# Patient Record
Sex: Female | Born: 1975
Health system: Southern US, Community
[De-identification: ages and names within clinical notes are randomized; demographics above are authoritative.]

## PROBLEM LIST (undated history)

## (undated) DIAGNOSIS — Z8489 Family history of other specified conditions: Secondary | ICD-10-CM

## (undated) DIAGNOSIS — I499 Cardiac arrhythmia, unspecified: Secondary | ICD-10-CM

---

## 2003-04-23 ENCOUNTER — Inpatient Hospital Stay (HOSPITAL_COMMUNITY): Admission: AD | Admit: 2003-04-23 | Discharge: 2003-04-26 | Payer: Self-pay | Admitting: Obstetrics and Gynecology

## 2003-04-25 ENCOUNTER — Encounter (INDEPENDENT_AMBULATORY_CARE_PROVIDER_SITE_OTHER): Payer: Self-pay

## 2003-10-02 ENCOUNTER — Other Ambulatory Visit: Admission: RE | Admit: 2003-10-02 | Discharge: 2003-10-02 | Payer: Self-pay | Admitting: Obstetrics and Gynecology

## 2004-06-25 ENCOUNTER — Ambulatory Visit (HOSPITAL_COMMUNITY): Admission: RE | Admit: 2004-06-25 | Discharge: 2004-06-25 | Payer: Self-pay | Admitting: Obstetrics and Gynecology

## 2005-01-02 ENCOUNTER — Other Ambulatory Visit: Admission: RE | Admit: 2005-01-02 | Discharge: 2005-01-02 | Payer: Self-pay | Admitting: Gynecology

## 2006-06-20 ENCOUNTER — Inpatient Hospital Stay (HOSPITAL_COMMUNITY): Admission: AD | Admit: 2006-06-20 | Discharge: 2006-06-22 | Payer: Self-pay | Admitting: Obstetrics and Gynecology

## 2006-06-21 ENCOUNTER — Encounter (INDEPENDENT_AMBULATORY_CARE_PROVIDER_SITE_OTHER): Payer: Self-pay | Admitting: *Deleted

## 2006-06-23 ENCOUNTER — Encounter: Admission: RE | Admit: 2006-06-23 | Discharge: 2006-07-22 | Payer: Self-pay | Admitting: Obstetrics and Gynecology

## 2009-11-29 ENCOUNTER — Emergency Department (HOSPITAL_COMMUNITY): Admission: EM | Admit: 2009-11-29 | Discharge: 2009-11-29 | Payer: Self-pay | Admitting: Emergency Medicine

## 2009-11-29 ENCOUNTER — Emergency Department (HOSPITAL_COMMUNITY): Admission: EM | Admit: 2009-11-29 | Discharge: 2009-11-29 | Payer: Self-pay | Admitting: Family Medicine

## 2010-09-09 LAB — CBC
HCT: 41.6 % (ref 36.0–46.0)
Hemoglobin: 14.1 g/dL (ref 12.0–15.0)
MCHC: 33.8 g/dL (ref 30.0–36.0)
MCV: 95.3 fL (ref 78.0–100.0)
Platelets: 290 10*3/uL (ref 150–400)
RBC: 4.37 MIL/uL (ref 3.87–5.11)
RDW: 12.7 % (ref 11.5–15.5)
WBC: 11.6 10*3/uL — ABNORMAL HIGH (ref 4.0–10.5)

## 2010-09-09 LAB — COMPREHENSIVE METABOLIC PANEL
ALT: 18 U/L (ref 0–35)
AST: 23 U/L (ref 0–37)
Albumin: 4.4 g/dL (ref 3.5–5.2)
Alkaline Phosphatase: 49 U/L (ref 39–117)
BUN: 9 mg/dL (ref 6–23)
CO2: 26 mEq/L (ref 19–32)
Calcium: 9.7 mg/dL (ref 8.4–10.5)
Chloride: 105 mEq/L (ref 96–112)
Creatinine, Ser: 0.64 mg/dL (ref 0.4–1.2)
GFR calc Af Amer: >60 mL/min (ref >60)
GFR calc non Af Amer: >60 mL/min (ref >60)
Glucose, Bld: 110 mg/dL — ABNORMAL HIGH (ref 70–99)
Potassium: 4.1 mEq/L (ref 3.5–5.1)
Sodium: 137 mEq/L (ref 135–145)
Total Bilirubin: 1.1 mg/dL (ref 0.3–1.2)
Total Protein: 7.5 g/dL (ref 6.0–8.3)

## 2010-09-09 LAB — URINE MICROSCOPIC-ADD ON

## 2010-09-09 LAB — URINALYSIS, ROUTINE W REFLEX MICROSCOPIC
Bilirubin Urine: NEGATIVE
Glucose, UA: NEGATIVE mg/dL
Hgb urine dipstick: NEGATIVE
Ketones, ur: NEGATIVE mg/dL
Nitrite: NEGATIVE
Protein, ur: NEGATIVE mg/dL
Specific Gravity, Urine: 1.01 (ref 1.005–1.030)
Urobilinogen, UA: 0.2 mg/dL (ref 0.0–1.0)
pH: 7 (ref 5.0–8.0)

## 2010-09-09 LAB — DIFFERENTIAL
Basophils Absolute: 0 10*3/uL (ref 0.0–0.1)
Basophils Relative: 0 % (ref 0–1)
Eosinophils Absolute: 0 10*3/uL (ref 0.0–0.7)
Eosinophils Relative: 0 % (ref 0–5)
Lymphocytes Relative: 14 % (ref 12–46)
Lymphs Abs: 1.6 10*3/uL (ref 0.7–4.0)
Monocytes Absolute: 0.4 10*3/uL (ref 0.1–1.0)
Monocytes Relative: 3 % (ref 3–12)
Neutro Abs: 9.5 10*3/uL — ABNORMAL HIGH (ref 1.7–7.7)
Neutrophils Relative %: 82 % — ABNORMAL HIGH (ref 43–77)

## 2010-09-09 LAB — PREGNANCY, URINE: Preg Test, Ur: NEGATIVE

## 2010-09-09 LAB — TSH: TSH: 1.358 u[IU]/mL (ref 0.350–4.500)

## 2010-09-09 LAB — MAGNESIUM: Magnesium: 2.2 mg/dL (ref 1.5–2.5)

## 2010-11-08 NOTE — Discharge Summary (Signed)
[name], [name]                 ACCOUNT NO.:  [id number]   MEDICAL RECORD NO.:  [id number]          PATIENT TYPE:  INP   LOCATION:                                F[facility]  [facility]   PHYSICIAN:  Malva Limes, M.D.    DATE OF BIRTH:  [birth year]   DATE OF ADMISSION:  06/20/2006  DATE OF DISCHARGE:  06/22/2006                               DISCHARGE SUMMARY   FINAL DIAGNOSES:  1. Intrauterine pregnancy at 38+ weeks gestation.  2. Induction of labor at term secondary to history of stillbirth      around [redacted] weeks gestation.  3. Positive group B streptococcus.  4. Nonreassuring fetal heart tones remote from vaginal delivery.   PROCEDURE:  Primary low transverse cesarean section.  Surgeon:  Dr.  Carrington Clamp.  Complications:  None.   This 35 year old G2, P1-0-0-0 presents at [redacted] weeks gestation for  induction secondary to her history of a stillbirth around 40 weeks.  The  patient had not had any complications prior to that and, of course,  discussion was held this pregnancy about inducing at 38 weeks.  Otherwise the patient's antepartum course had been uncomplicated.  She  did have a positive group B strep culture obtained in the office at 35  weeks and the patient was monitored closely at the end of her pregnancy  with nonstress tests that were reactive.  She was admitted this time for  induction.  She was started on clindamycin for her group B strep since  she was allergic to PENICILLIN.  AROM was performed and scalp electrodes  were placed.  Nonreassuring fetal heart tones were noted and the patient  was only close to 2 cm of dilation and was remote from delivery.  At  this point a discussion was held and the patient was taken to the  operating room for a cesarean section.  The patient was taken to the  operating room on June 21, 2006, by Dr. Carrington Clamp where a  primary low transverse cesarean section was performed with the delivery  of a 5-pound 15-ounce female infant  with Apgars of 6 and 9.  There was a  cord blood pH of 7.19.  There were some small intramural fibroids that  were noted, some near the incision site as well.  The delivery went  without complications.  The patient's postoperative course was benign  without any significant fevers.  The patient desired her little boy  circumcised before discharge, which was performed.  She was felt ready  for discharge on postoperative day #2.  She was sent home on a regular  diet, told to decrease activities, told to continue her prenatal  vitamins as needed, was given Percocet one to two every 4-6 hours as  needed for pain, told she could use Motrin up to 600 mg every 6 hours as  needed for pain, was to follow up in our office in 4 weeks.   LABORATORIES ON DISCHARGE:  The patient had a hemoglobin of 9.8 which  was down from a high of 13.5 preoperatively.  She had a  white blood cell  count of 15.1 and platelets of 222,000.      Leilani Able, P.A.-C.    ______________________________  Malva Limes, M.D.    MB/MEDQ  D:  07/06/2006  T:  07/06/2006  Job:  [id number]

## 2010-11-08 NOTE — H&P (Signed)
NAMEREGINALD, MANGELS                           ACCOUNT NO.:  [id number]   MEDICAL RECORD NO.:  [id number]                   PATIENT TYPE:  INP   LOCATION:  9172                                 FACILITY:  [facility]   PHYSICIAN:  Hal Morales, M.D.             DATE OF BIRTH:  [birth year]   DATE OF ADMISSION:  04/23/2003  DATE OF DISCHARGE:                                HISTORY & PHYSICAL   HISTORY OF PRESENT ILLNESS:  Ms. Creswell is a 35 year old gravida 1, para 0,  EDD April 22, 2003, by dates, confirmed with ultrasound.  She presents  today with complaint of no fetal movement since last evening.  The baby was  moving normally prior to going to sleep, and the patient has not felt any  fetal movement at all since waking up today.  Time of phone call was 1348.  She was instructed to present to Maternity Admissions for evaluation.  On  admission, no fetal heart tones were audible by Doppler and no cardiac  motion was visible on ultrasound.  The patient relates having irregular  contractions yesterday on April 22, 2003, throughout the day, no  contractions today, no pain or pressure, no bleeding, no leaking of fluid,  no unusual symptoms other than no fetal movement.  Following ultrasound exam  by technician finding fetal demise, Dr. Pennie Rushing was notified and presented  to the hospital to discuss options with the patient.   PAST OBSTETRICAL HISTORY:  Significant to transfer to [facility] on January 31, 2003, at 28 weeks and 3 days from [facility] where she initially  received prenatal care through her pregnancy.  Her one hour glucose value  was elevated.  She had three hour GTT on March 15, 2003, Dr. Estanislado Pandy  reports that her three hour GTT was normal with a fasting blood sugar of  108, consistent with glucose intolerance.  At that time, ultrasound exam  identified a complete placenta previa.  Ultrasound was repeated at 36 weeks  with finding that the previa had resolved.  At  38 weeks her fasting blood  sugar was 98, and two hour p.c. blood sugar was 153.  She had been  normotensive with no proteinuria.  Prenatal lab work on September 01, 2002:  Hemoglobin 13.3, blood type RHO positive, antibody screen negative, VDRL  nonreactive, rubella immune, hepatitis B surface antigen negative, HIV  negative, 18 weeks glucose within normal limits.  On January 31, 2003, urine  culture showed no growth.  Pap smear on June 2004, was within normal limits.  GC and Chlamydia negative.  Varicella immune.  Cystic fibrosis testing  negative.  Triple marker testing negative.  At 36 weeks, culture of the  vaginal tract is positive for Group B Strep.   PAST MEDICAL HISTORY:  Infertility x1 year.  The patient conceived utilizing  Clomid for one cycle, otherwise, her medical history is unremarkable.  ALLERGIES:  PENICILLIN and SULFA which causes a rash and shortness of  breath.   FAMILY HISTORY:  Paternal grandfather and maternal grandfather MI, deceased.  The patient's brother, mom, and dad, paternal grandfather and maternal  grandfather with a history of chronic hypertension, on medications.  Maternal grandmother with a history of phlebitis.  Maternal grandfather  emphysema.  Maternal grandmother COPD.  Paternal grandmother diabetes,  insulin dependent.  The patient's father, diabetes, diet controlled.  Maternal grandmother with cervical cancer.  Maternal grandfather lung  cancer, deceased.  Maternal grandfather with a history of CVA.  The  patient's mother with a history of depression.  Dad's paternal grandfather  and maternal grandfather history is significant for alcohol use.   PAST SURGICAL HISTORY:  Wisdom tooth removal.   SOCIAL HISTORY:  She denies the use of tobacco, alcohol, or illicit drugs.  Ms. Mounsey is a 35 year old Caucasian female, she is married to Geraldo Docker who is involved and supportive.  They have recently moved to  [facility], [place].  The  patient is a homemaker and her  husband works for [employer].   REVIEW OF SYSTEMS:  As described above.  The patient called with no fetal  movement today, and on admission to hospital was found to have a fetal  demise.   PHYSICAL EXAMINATION:  The patient's vital signs were stable, she is  afebrile.  Ultrasound examination found fetal demise at term.  At her last  OB visit on April 20, 2003, her cervix was noted to be 1 cm dilated, 50%  effaced, and -2 station.   ASSESSMENT:  1. Intrauterine pregnancy at term.  2. Intrauterine fetal demise.   PLAN:  Dr. Pennie Rushing discussed these findings with the patient and her  husband.  She discussed the options for management and reviewed these  options, including immediate induction, delaying induction as long as no  evidence of DIC, cesarean section delivery, although not recommended was  discussed as an option.  Blood workup to look for etiology of intrauterine  fetal demise, pregnancy induced hypertension labs, hemoglobin A1C, ANA, LAC,  and ACLA and a DIC panel.  The patient and husband will discuss options and  make decision.     Rica Koyanagi, C.N.M.               Hal Morales, M.D.    SDM/MEDQ  D:  04/23/2003  T:  04/23/2003  Job:  [id number]

## 2010-11-08 NOTE — Op Note (Signed)
[name], [name]                 ACCOUNT NO.:  [id number]   MEDICAL RECORD NO.:  [id number]          PATIENT TYPE:  INP   LOCATION:  9162                          FACILITY:  [facility]   PHYSICIAN:  Carrington Clamp, M.D. DATE OF BIRTH:  21-Sep-1975   DATE OF PROCEDURE:  06/21/2006  DATE OF DISCHARGE:                               OPERATIVE REPORT   PREOPERATIVE DIAGNOSIS:  Nonreassuring fetal heart tones remote from a  vaginal delivery.   POSTOPERATIVE DIAGNOSIS:  Nonreassuring fetal heart tones remote from a  vaginal delivery.   PROCEDURES:  Primary low transverse cesarean section.   ATTENDING SURGEON:  Carrington Clamp, M.D.   ANESTHESIA:  Epidural.   ESTIMATED BLOOD LOSS:  700 cc..   IV FLUIDS:  1500 cc.   URINE OUTPUT:  100 cc.   COMPLICATIONS:  None.   FINDINGS:  Female infant; vertex presentation, Apgars 6 and 9.  Cord blood  7.19.  Weight 5 pounds 15 ounces.  There were normal tubes and ovaries  seen.  There were multiple small intramural fibroids seen, one of which  was in the incision site.   PATHOLOGY:  Fibroid and placenta.   MEDICATIONS:  Clindamycin preoperatively and Pitocin.   COUNTS:  Correct x3.   TECHNIQUE:  After adequate epidural anesthesia was achieved, the patient  was prepped and draped in the usual sterile fashion in the dorsal supine  position with a leftward tilt.  A  Pfannenstiel skin incision was made  with the scalpel and carried down to the fascia with the Bovie cautery.  The fascia was incised in midline with the scalpel and then carried in a  transverse curvilinear manner with the Mayo scissors.  Fascia was  reflected superiorly and inferiorly from the rectus muscles.  The rectus  muscles were split in the midline.  The bowel free portion of peritoneum  was entered into bluntly and the peritoneum was opened bluntly, with  good visualization of the bowel and the bladder.   The bladder blade was placed and the vesicouterine fascia tented up  in a  transverse curvilinear manner.  The bladder flap was created with blunt  dissection and the bladder blade replaced.  A centimeter incision was  made in the upper portion of the lower uterine segment.  This was done  until clear fluid was noted on entry into the amnion.  The incision was  extended in a transverse curvilinear manner.  The baby was identified in  the vertex presentation and delivered without complication.  The baby  was in the OP presentation and there was cord wrapped once around the  neck.  The baby was bulb suctioned.  The cord was clamped and cut and  the baby was handed to awaiting pediatrics.  Cord pH was obtained which  was 7.19.  The placenta was then delivered manually and cord bloods were  obtained by the cord blood donation nurses.  The uterus was then  exteriorized, and then wet lap and  cleared of all debris.  The uterus  was closed with a running locked stitch of 0 Monocryl.  An imbricating  layer of 0 Monocryl was performed.  Multiple figure-of-eight stitches  were then used to ensure hemostasis.  A small 2x3 cm fibroid was in the  uterine incision, and this had been removed with blunt and sharp  dissection with the Bovie cautery.  The site was then closed with the  uterine incision closures.  The fibroid was sent to pathology.   The uterus was reapproximated.  The abdomen cleared of all debris with  irrigation.  The uterine incision was reinspected, found to be  hemostatic.  The peritoneum was then closed with running stitch of 2-0  Vicryl.  The rectus muscles were rendered hemostatic and the fascia was  closed with a running stitch of 0 Vicryl.  Subcutaneous tissue was  rendered hemostatic with Bovie cautery and irrigation.  Three  interrupted stitches of 2-0 plain gut was used to close the subcutaneous  space, and the skin was closed with staples.  The patient tolerated the  procedure well.  She was returned to recovery in stable condition.       Carrington Clamp, M.D.  Electronically Signed     MH/MEDQ  D:  06/21/2006  T:  06/21/2006  Job:  [id number]

## 2010-11-08 NOTE — Discharge Summary (Signed)
[name], [name]                           ACCOUNT NO.:  [id number]   MEDICAL RECORD NO.:  [id number]                   PATIENT TYPE:  INP   LOCATION:  9308                                 [facility]ACILITY:  [facility]   PHYSICIAN:  Hal Morales, M.D.             DATE OF BIRTH:  [birth year]   DATE OF ADMISSION:  04/23/2003  DATE OF DISCHARGE:  04/26/2003                                 DISCHARGE SUMMARY   ADMITTING DIAGNOSIS:  Intrauterine fetal demise at term.   PROCEDURE:  Spontaneous vaginal delivery.   DISCHARGE DIAGNOSES:  1. Intrauterine fetal demise at term.  2. Spontaneous vaginal delivery.  3. Shoulder dystocia requiring McRoberts maneuver, suprapubic pressure,     midline episiotomy, and corkscrew maneuver.   [name] is a 35 year old gravida 1 para 0 who presented to the hospital  at 40 and one-seventh weeks with complaint of decreased fetal movement.  No  fetal heart tones were audible.  Ultrasound examination found no cardiac  activity.  Dr. Dierdre Forth discussed these findings and plan with the  patient and her husband.  Blood work was obtained for PIH, hemoglobin A1c,  ANA, LAC, AC, LA, and DIC panel.  All above lab work was within normal  limits.  The patient consented to induction of labor which was accomplished  utilizing Cytotec.  The patient did labor, became complete at 2321 on  April 25, 2003.  She labored down for two hours, pushed well, and had a  spontaneous vaginal delivery at 0300 by Dr. Dois Davenport Rivard.  The patient had  a little boy infant named Barbara Cower with no signs of life.  The patient  experienced shoulder dystocia at delivery requiring McRoberts maneuver,  suprapubic pressure, midline episiotomy, and corkscrew maneuver.  Dr. Estanislado Pandy  found one loose nuchal cord reduced at perineum and observed a thrombosed  cord, general __________, and no gross anomalies noted.  The patient did  experience uterine atony and 1000 mcg of Cytotec was placed rectally  which  stopped excessive bleeding and fundus remained firm from that point.  On  postpartum day #1 hemoglobin was 7.6.  The patient showed no signs of  dizziness or syncope and requested early discharge to make plans for funeral  arrangements and to be in supportive environment of family.  Discharge was  okayed with Dr. Dois Davenport Rivard as the patient remained in stable condition.  Discharge follow-up was to be with [facility] in four to six weeks.  At time of  discharge the patient had had support through pastoral contact at the  hospital and their own pastor and help with arrangements of autopsy and  memorial service.     Rica Koyanagi, C.N.M.               Hal Morales, M.D.    SDM/MEDQ  D:  06/05/2003  T:  06/05/2003  Job:  [id number]

## 2012-02-18 ENCOUNTER — Other Ambulatory Visit: Payer: Self-pay | Admitting: Obstetrics and Gynecology

## 2012-02-18 DIAGNOSIS — M7989 Other specified soft tissue disorders: Secondary | ICD-10-CM

## 2012-02-20 ENCOUNTER — Other Ambulatory Visit: Payer: Self-pay

## 2012-02-24 ENCOUNTER — Ambulatory Visit
Admission: RE | Admit: 2012-02-24 | Discharge: 2012-02-24 | Disposition: A | Discharge status: Home / Self Care | Payer: BC Managed Care – PPO | Source: Ambulatory Visit | Attending: Obstetrics and Gynecology | Admitting: Obstetrics and Gynecology

## 2012-02-24 DIAGNOSIS — M7989 Other specified soft tissue disorders: Secondary | ICD-10-CM

## 2012-02-24 MED ORDER — IOHEXOL 300 MG/ML  SOLN
100.0000 mL | Freq: Once | INTRAMUSCULAR | Status: AC | PRN
  Administered 2012-02-24: 100 mL via INTRAVENOUS

## 2012-04-01 ENCOUNTER — Ambulatory Visit (INDEPENDENT_AMBULATORY_CARE_PROVIDER_SITE_OTHER): Payer: BC Managed Care – PPO | Admitting: Surgery

## 2012-04-01 ENCOUNTER — Encounter (INDEPENDENT_AMBULATORY_CARE_PROVIDER_SITE_OTHER): Payer: Self-pay | Admitting: Surgery

## 2012-04-01 VITALS — BP 141/85 | HR 85 | Temp 97.0°F | Resp 12 | Ht 60.0 in | Wt 140.0 lb

## 2012-04-01 DIAGNOSIS — R222 Localized swelling, mass and lump, trunk: Secondary | ICD-10-CM | POA: Insufficient documentation

## 2012-04-01 DIAGNOSIS — R1900 Intra-abdominal and pelvic swelling, mass and lump, unspecified site: Secondary | ICD-10-CM

## 2012-04-01 NOTE — Progress Notes (Signed)
Patient ID: [name], female   DOB: 22-Apr-1976, 36 y.o.   MRN: [id number]  Chief Complaint  Patient presents with  . Other    abdominal wall mass    HPI [name] is a 36 y.o. female.   HPI She is referred by Dr. Henderson Cloud for evaluation of abdominal wall mass. This is been present for almost a year and is getting larger. She was having discomfort in the lower abdomen, but only started noticing the mass after she started exercising and losing weight. She has pain almost daily with the mass. It may slightly get worse with her periods. She has had no fevers or chills and constitutionally is normal. History reviewed. No pertinent past medical history.  Past Surgical History  Procedure Date  . Cesarean section     History reviewed. No pertinent family history.  Social History History  Substance Use Topics  . Smoking status: Never Smoker   . Smokeless tobacco: Not on file  . Alcohol Use: Yes     1/2 drink a week    Allergies  Allergen Reactions  . Penicillins Hives and Shortness Of Breath    Current Outpatient Prescriptions  Medication Sig Dispense Refill  . Acetaminophen (TYLENOL 8 HOUR PO) Take by mouth.      Marland Kitchen ibuprofen (ADVIL,MOTRIN) 200 MG tablet Take 200 mg by mouth every 6 (six) hours as needed.        Review of Systems Review of Systems  Constitutional: Negative for fever, chills and unexpected weight change.  HENT: Negative for hearing loss, congestion, sore throat, trouble swallowing and voice change.   Eyes: Negative for visual disturbance.  Respiratory: Negative for cough and wheezing.   Cardiovascular: Negative for chest pain, palpitations and leg swelling.  Gastrointestinal: Negative for nausea, vomiting, abdominal pain, diarrhea, constipation, blood in stool, abdominal distention and anal bleeding.  Genitourinary: Negative for hematuria, vaginal bleeding and difficulty urinating.  Musculoskeletal: Negative for arthralgias.  Skin: Negative for rash and  wound.  Neurological: Negative for seizures, syncope and headaches.  Hematological: Negative for adenopathy. Does not bruise/bleed easily.  Psychiatric/Behavioral: Negative for confusion.    Blood pressure 141/85, pulse 85, temperature 97 F (36.1 C), temperature source Temporal, resp. rate 12, height 5' (1.524 m), weight 140 lb (63.504 kg).  Physical Exam Physical Exam  Constitutional: She is oriented to person, place, and time. She appears well-developed and well-nourished. No distress.  HENT:  Head: Normocephalic and atraumatic.  Right Ear: External ear normal.  Left Ear: External ear normal.  Nose: Nose normal.  Mouth/Throat: Oropharynx is clear and moist.  Eyes: Conjunctivae normal are normal. Pupils are equal, round, and reactive to light. Right eye exhibits no discharge. Left eye exhibits no discharge. No scleral icterus.  Neck: Normal range of motion. Neck supple. No tracheal deviation present. No thyromegaly present.  Cardiovascular: Normal rate, regular rhythm, normal heart sounds and intact distal pulses.   No murmur heard. Pulmonary/Chest: Effort normal and breath sounds normal. No respiratory distress. She has no wheezes. She has no rales.  Abdominal: Soft. Bowel sounds are normal. She exhibits mass. She exhibits no distension. There is no tenderness.       There is a 3-1/2-4 cm firm hard mass just to the left of the midline underneath her C-section scar. It is slightly mobile. It is minimally tender.there is no erythema or skin changes  Musculoskeletal: Normal range of motion. She exhibits no edema and no tenderness.  Lymphadenopathy:    She has no cervical  adenopathy.  Neurological: She is alert and oriented to person, place, and time.  Skin: Skin is warm and dry. No rash noted. She is not diaphoretic. No erythema.  Psychiatric: Her behavior is normal. Judgment normal.    Data Reviewed I have reviewed the CAT scan demonstrating the large 3-1/2-4 cm mass in the  subcutaneous tissue of the lower abdomen.  The mass did not appear to go down into the muscle  Assessment    Abdominal wall mass of uncertain etiology    Plan    This may represent a desmoid tumor or endometriosis. Wide excision of the mass is recommended for histologic evaluation and because of her symptoms. I explained this to her in detail. Surgical procedure which includes but is not limited to bleeding, infection, recurrence, need for further surgery, possible placement of mesh if I have to violate the fascia, et Karie Soda. She understands and wished to proceed. Likelihood of success is good       Yuvin Bussiere A 04/01/2012, 11:45 AM

## 2012-04-06 ENCOUNTER — Encounter (INDEPENDENT_AMBULATORY_CARE_PROVIDER_SITE_OTHER): Payer: Self-pay

## 2012-04-13 ENCOUNTER — Encounter (HOSPITAL_COMMUNITY): Payer: Self-pay | Admitting: Pharmacy Technician

## 2012-04-21 ENCOUNTER — Encounter (HOSPITAL_COMMUNITY): Payer: Self-pay

## 2012-04-21 ENCOUNTER — Encounter (HOSPITAL_COMMUNITY)
Admission: RE | Admit: 2012-04-21 | Discharge: 2012-04-21 | Disposition: A | Discharge status: Home / Self Care | Payer: BC Managed Care – PPO | Source: Ambulatory Visit | Attending: Surgery | Admitting: Surgery

## 2012-04-21 HISTORY — DX: Cardiac arrhythmia, unspecified: I49.9

## 2012-04-21 HISTORY — DX: Family history of other specified conditions: Z84.89

## 2012-04-21 LAB — SURGICAL PCR SCREEN
MRSA, PCR: NEGATIVE
Staphylococcus aureus: NEGATIVE

## 2012-04-21 LAB — HCG, SERUM, QUALITATIVE: Preg, Serum: NEGATIVE

## 2012-04-21 LAB — CBC
MCH: 31.1 pg (ref 26.0–34.0)
MCHC: 33.4 g/dL (ref 30.0–36.0)
Platelets: 310 10*3/uL (ref 150–400)
RDW: 12.8 % (ref 11.5–15.5)

## 2012-04-21 NOTE — Pre-Procedure Instructions (Signed)
20 [name]  04/21/2012   Your procedure is scheduled on:  Wednesday, November 6th  Report to [facility] at 1130 AM.  Call this number if you have problems the morning of surgery: [phone]   Remember:   Do not eat food or drink:After Midnight.   Take these medicines the morning of surgery with A SIP OF WATER: tylenol if needed   Do not wear jewelry, make-up or nail polish.  Do not wear lotions, powders, or perfumes.   Do not shave 48 hours prior to surgery.   Do not bring valuables to the hospital.  Contacts, dentures or bridgework may not be worn into surgery.  Leave suitcase in the car. After surgery it may be brought to your room.  For patients admitted to the hospital, checkout time is 11:00 AM the day of discharge.   Patients discharged the day of surgery will not be allowed to drive home.   Special Instructions: Shower using CHG 2 nights before surgery and the night before surgery.  If you shower the day of surgery use CHG.  Use special wash - you have one bottle of CHG for all showers.  You should use approximately 1/3 of the bottle for each shower.   Please read over the following fact sheets that you were given: Pain Booklet, Coughing and Deep Breathing, MRSA Information and Surgical Site Infection Prevention

## 2012-04-27 MED ORDER — CIPROFLOXACIN IN D5W 400 MG/200ML IV SOLN
400.0000 mg | INTRAVENOUS | Status: AC
  Administered 2012-04-28: 400 mg via INTRAVENOUS
  Filled 2012-04-27: qty 200

## 2012-04-27 NOTE — H&P (Signed)
Patient ID: [name], female DOB: [birth year], 36 y.o. MRN: [id number]  Chief Complaint   Patient presents with   .  Other     abdominal wall mass    HPI  [name] 36 y.o. female.  HPI  She is referred by Dr. Henderson [name] for evaluation of abdominal wall mass. This is been present for almost [name] year and is getting larger. She was having discomfort in the lower abdomen, but only started noticing the mass after she started exercising and losing weight. She has pain almost daily with the mass. It may slightly get worse with her periods. She has had no fevers or chills and constitutionally is normal.  History reviewed. No pertinent past medical history.  Past Surgical History   Procedure  Date   .  Cesarean section     History reviewed. No pertinent family history.  Social History  History   Substance Use Topics   .  Smoking status:  Never Smoker   .  Smokeless tobacco:  Not on file   .  Alcohol Use:  Yes      1/2 drink [name] week    Allergies   Allergen  Reactions   .  Penicillins  Hives and Shortness Of Breath    Current Outpatient Prescriptions   Medication  Sig  Dispense  Refill   .  Acetaminophen (TYLENOL 8 HOUR PO)  Take by mouth.     Marland Kitchen  ibuprofen (ADVIL,MOTRIN) 200 MG tablet  Take 200 mg by mouth every 6 (six) hours as needed.      Review of Systems  Review of Systems  Constitutional: Negative for fever, chills and unexpected weight change.  HENT: Negative for hearing loss, congestion, sore throat, trouble swallowing and voice change.  Eyes: Negative for visual disturbance.  Respiratory: Negative for cough and wheezing.  Cardiovascular: Negative for chest pain, palpitations and leg swelling.  Gastrointestinal: Negative for nausea, vomiting, abdominal pain, diarrhea, constipation, blood in stool, abdominal distention and anal bleeding.  Genitourinary: Negative for hematuria, vaginal bleeding and difficulty urinating.  Musculoskeletal: Negative for arthralgias.  Skin:  Negative for rash and wound.  Neurological: Negative for seizures, syncope and headaches.  Hematological: Negative for adenopathy. Does not bruise/bleed easily.  Psychiatric/Behavioral: Negative for confusion.   Blood pressure 141/85, pulse 85, temperature 97 F (36.1 C), temperature source Temporal, resp. rate 12, height 5' (1.524 m), weight 140 lb (63.504 kg).  Physical Exam  Physical Exam  Constitutional: She is oriented to person, place, and time. She appears well-developed and well-nourished. No distress.  HENT:  Head: Normocephalic and atraumatic.  Right Ear: External ear normal.  Left Ear: External ear normal.  Nose: Nose normal.  Mouth/Throat: Oropharynx is clear and moist.  Eyes: Conjunctivae normal are normal. Pupils are equal, round, and reactive to light. Right eye exhibits no discharge. Left eye exhibits no discharge. No scleral icterus.  Neck: Normal range of motion. Neck supple. No tracheal deviation present. No thyromegaly present.  Cardiovascular: Normal rate, regular rhythm, normal heart sounds and intact distal pulses.  No murmur heard.  Pulmonary/Chest: Effort normal and breath sounds normal. No respiratory distress. She has no wheezes. She has no rales.  Abdominal: Soft. Bowel sounds are normal. She exhibits mass. She exhibits no distension. There is no tenderness.  There is [name] 3-1/2-4 cm firm hard mass just to the left of the midline underneath her C-section scar. It is slightly mobile. It is minimally tender.there is no erythema or skin  changes  Musculoskeletal: Normal range of motion. She exhibits no edema and no tenderness.  Lymphadenopathy:  She has no cervical adenopathy.  Neurological: She is alert and oriented to person, place, and time.  Skin: Skin is warm and dry. No rash noted. She is not diaphoretic. No erythema.  Psychiatric: Her behavior is normal. Judgment normal.   Data Reviewed  I have reviewed the CAT scan demonstrating the large 3-1/2-4 cm mass in  the subcutaneous tissue of the lower abdomen. The mass did not appear to go down into the muscle  Assessment   Abdominal wall mass of uncertain etiology   Plan   This may represent [name] desmoid tumor or endometriosis. Wide excision of the mass is recommended for histologic evaluation and because of her symptoms. I explained this to her in detail.  Surgical procedure which includes but is not limited to bleeding, infection, recurrence, need for further surgery, possible placement of mesh if I have to violate the fascia, et Karie Soda. She understands and wished to proceed. Likelihood of success is good   [name]

## 2012-04-28 ENCOUNTER — Encounter (HOSPITAL_COMMUNITY): Payer: Self-pay | Admitting: Certified Registered Nurse Anesthetist

## 2012-04-28 ENCOUNTER — Encounter (HOSPITAL_COMMUNITY)
Admission: RE | Disposition: A | Discharge status: Home / Self Care | Payer: Self-pay | Source: Ambulatory Visit | Attending: Surgery

## 2012-04-28 ENCOUNTER — Ambulatory Visit (HOSPITAL_COMMUNITY): Payer: BC Managed Care – PPO | Admitting: Certified Registered Nurse Anesthetist

## 2012-04-28 ENCOUNTER — Ambulatory Visit (HOSPITAL_COMMUNITY)
Admission: RE | Admit: 2012-04-28 | Discharge: 2012-04-28 | Disposition: A | Discharge status: Home / Self Care | Payer: BC Managed Care – PPO | Source: Ambulatory Visit | Attending: Surgery | Admitting: Surgery

## 2012-04-28 DIAGNOSIS — Z01812 Encounter for preprocedural laboratory examination: Secondary | ICD-10-CM | POA: Insufficient documentation

## 2012-04-28 DIAGNOSIS — N806 Endometriosis in cutaneous scar: Secondary | ICD-10-CM

## 2012-04-28 DIAGNOSIS — N808 Other endometriosis: Secondary | ICD-10-CM | POA: Insufficient documentation

## 2012-04-28 HISTORY — PX: MASS EXCISION: SHX2000

## 2012-04-28 SURGERY — EXCISION MASS
Anesthesia: General | Site: Abdomen | Wound class: Clean

## 2012-04-28 MED ORDER — BUPIVACAINE-EPINEPHRINE PF 0.25-1:200000 % IJ SOLN
INTRAMUSCULAR | Status: AC
  Filled 2012-04-28: qty 30

## 2012-04-28 MED ORDER — LIDOCAINE HCL 1 % IJ SOLN
INTRAMUSCULAR | Status: DC | PRN
  Administered 2012-04-28: 90 mg via INTRADERMAL

## 2012-04-28 MED ORDER — KETOROLAC TROMETHAMINE 30 MG/ML IJ SOLN
30.0000 mg | Freq: Once | INTRAMUSCULAR | Status: AC
  Administered 2012-04-28: 30 mg via INTRAVENOUS
  Filled 2012-04-28: qty 1

## 2012-04-28 MED ORDER — HYDROMORPHONE HCL PF 1 MG/ML IJ SOLN
INTRAMUSCULAR | Status: AC
  Filled 2012-04-28: qty 1

## 2012-04-28 MED ORDER — PHENYLEPHRINE HCL 10 MG/ML IJ SOLN
INTRAMUSCULAR | Status: DC | PRN
  Administered 2012-04-28 (×3): 40 ug via INTRAVENOUS

## 2012-04-28 MED ORDER — HYDROMORPHONE HCL PF 1 MG/ML IJ SOLN
0.2500 mg | INTRAMUSCULAR | Status: DC | PRN
  Administered 2012-04-28: 0.5 mg via INTRAVENOUS

## 2012-04-28 MED ORDER — ARTIFICIAL TEARS OP OINT
TOPICAL_OINTMENT | OPHTHALMIC | Status: DC | PRN
  Administered 2012-04-28: 1 via OPHTHALMIC

## 2012-04-28 MED ORDER — MIDAZOLAM HCL 5 MG/5ML IJ SOLN
INTRAMUSCULAR | Status: DC | PRN
  Administered 2012-04-28: 2 mg via INTRAVENOUS

## 2012-04-28 MED ORDER — BUPIVACAINE HCL (PF) 0.25 % IJ SOLN
INTRAMUSCULAR | Status: AC
  Filled 2012-04-28: qty 30

## 2012-04-28 MED ORDER — PROMETHAZINE HCL 25 MG/ML IJ SOLN: 6.2500 mg | INTRAMUSCULAR | Status: DC | PRN

## 2012-04-28 MED ORDER — OXYCODONE HCL 5 MG PO TABS: 5.0000 mg | ORAL_TABLET | Freq: Once | ORAL | Status: DC | PRN

## 2012-04-28 MED ORDER — ONDANSETRON HCL 4 MG/2ML IJ SOLN
INTRAMUSCULAR | Status: DC | PRN
  Administered 2012-04-28: 4 mg via INTRAVENOUS

## 2012-04-28 MED ORDER — 0.9 % SODIUM CHLORIDE (POUR BTL) OPTIME
TOPICAL | Status: DC | PRN
  Administered 2012-04-28: 1000 mL

## 2012-04-28 MED ORDER — BUPIVACAINE-EPINEPHRINE 0.25% -1:200000 IJ SOLN
INTRAMUSCULAR | Status: DC | PRN
  Administered 2012-04-28: 20 mL

## 2012-04-28 MED ORDER — HYDROCODONE-ACETAMINOPHEN 5-325 MG PO TABS: 1.0000 | ORAL_TABLET | ORAL | Status: DC | PRN

## 2012-04-28 MED ORDER — FENTANYL CITRATE 0.05 MG/ML IJ SOLN
INTRAMUSCULAR | Status: DC | PRN
  Administered 2012-04-28: 100 ug via INTRAVENOUS

## 2012-04-28 MED ORDER — OXYCODONE HCL 5 MG/5ML PO SOLN: 5.0000 mg | Freq: Once | ORAL | Status: DC | PRN

## 2012-04-28 MED ORDER — PROPOFOL 10 MG/ML IV BOLUS
INTRAVENOUS | Status: DC | PRN
  Administered 2012-04-28: 200 mg via INTRAVENOUS

## 2012-04-28 MED ORDER — LACTATED RINGERS IV SOLN
INTRAVENOUS | Status: DC | PRN
  Administered 2012-04-28: 12:00:00 via INTRAVENOUS

## 2012-04-28 SURGICAL SUPPLY — 40 items
APL SKNCLS STERI-STRIP NONHPOA (GAUZE/BANDAGES/DRESSINGS) ×2
BENZOIN TINCTURE PRP APPL 2/3 (GAUZE/BANDAGES/DRESSINGS) ×2 IMPLANT
BLADE SURG ROTATE 9660 (MISCELLANEOUS) IMPLANT
CANISTER SUCTION 2500CC (MISCELLANEOUS) ×3 IMPLANT
CHLORAPREP W/TINT 26ML (MISCELLANEOUS) ×3 IMPLANT
CLOTH BEACON ORANGE TIMEOUT ST (SAFETY) ×3 IMPLANT
COVER SURGICAL LIGHT HANDLE (MISCELLANEOUS) ×3 IMPLANT
DRAPE LAPAROSCOPIC ABDOMINAL (DRAPES) ×3 IMPLANT
DRESSING TELFA 8X3 (GAUZE/BANDAGES/DRESSINGS) IMPLANT
DRSG TEGADERM 4X4.75 (GAUZE/BANDAGES/DRESSINGS) ×3 IMPLANT
ELECT CAUTERY BLADE 6.4 (BLADE) ×3 IMPLANT
ELECT REM PT RETURN 9FT ADLT (ELECTROSURGICAL) ×3
ELECTRODE REM PT RTRN 9FT ADLT (ELECTROSURGICAL) ×2 IMPLANT
GLOVE BIOGEL PI IND STRL 7.0 (GLOVE) ×1 IMPLANT
GLOVE BIOGEL PI IND STRL 7.5 (GLOVE) ×1 IMPLANT
GLOVE BIOGEL PI INDICATOR 7.0 (GLOVE) ×1
GLOVE BIOGEL PI INDICATOR 7.5 (GLOVE) ×1
GLOVE ECLIPSE 7.5 STRL STRAW (GLOVE) ×2 IMPLANT
GLOVE SURG SIGNA 7.5 PF LTX (GLOVE) ×3 IMPLANT
GOWN PREVENTION PLUS XLARGE (GOWN DISPOSABLE) ×3 IMPLANT
GOWN STRL NON-REIN LRG LVL3 (GOWN DISPOSABLE) ×6 IMPLANT
KIT BASIN OR (CUSTOM PROCEDURE TRAY) ×3 IMPLANT
KIT ROOM TURNOVER OR (KITS) ×3 IMPLANT
NDL HYPO 25GX1X1/2 BEV (NEEDLE) ×1 IMPLANT
NEEDLE HYPO 25GX1X1/2 BEV (NEEDLE) ×3 IMPLANT
NS IRRIG 1000ML POUR BTL (IV SOLUTION) ×3 IMPLANT
PACK GENERAL/GYN (CUSTOM PROCEDURE TRAY) ×3 IMPLANT
PAD ARMBOARD 7.5X6 YLW CONV (MISCELLANEOUS) ×3 IMPLANT
SPONGE GAUZE 4X4 12PLY (GAUZE/BANDAGES/DRESSINGS) IMPLANT
STAPLER VISISTAT 35W (STAPLE) IMPLANT
STRIP CLOSURE SKIN 1/2X4 (GAUZE/BANDAGES/DRESSINGS) ×2 IMPLANT
SUT MNCRL AB 4-0 PS2 18 (SUTURE) ×3 IMPLANT
SUT NOVA NAB DX-16 0-1 5-0 T12 (SUTURE) ×4 IMPLANT
SUT PROLENE 1 CT (SUTURE) IMPLANT
SUT VIC AB 3-0 SH 27 (SUTURE) ×3
SUT VIC AB 3-0 SH 27XBRD (SUTURE) ×2 IMPLANT
SYR CONTROL 10ML LL (SYRINGE) ×3 IMPLANT
TOWEL OR 17X24 6PK STRL BLUE (TOWEL DISPOSABLE) ×3 IMPLANT
TOWEL OR 17X26 10 PK STRL BLUE (TOWEL DISPOSABLE) ×3 IMPLANT
TRAY FOLEY CATH 14FRSI W/METER (CATHETERS) IMPLANT

## 2012-04-28 NOTE — Anesthesia Postprocedure Evaluation (Signed)
Anesthesia Post Note  Patient: [name]  Procedure(s) Performed: Procedure(s) (LRB): EXCISION MASS (N/A)  Anesthesia type: general  Patient location: PACU  Post pain: Pain level controlled  Post assessment: Patient's Cardiovascular Status Stable  Last Vitals:  Filed Vitals:   04/28/12 1414  BP: 98/54  Pulse: 58  Temp:   Resp: 16    Post vital signs: Reviewed and stable  Level of consciousness: sedated  Complications: No apparent anesthesia complications

## 2012-04-28 NOTE — Interval H&P Note (Signed)
History and Physical Interval Note:  No change in H and P  04/28/2012 11:25 AM  [name]  has presented today for surgery, with the diagnosis of Abdominal wall mass  The various methods of treatment have been discussed with the patient and family. After consideration of risks, benefits and other options for treatment, the patient has consented to Excision of abdominal wall mass, possible repair of fascia with mesh  as a surgical intervention .  The patient's history has been reviewed, patient examined, no change in status, stable for surgery.  I have reviewed the patient's chart and labs.  Questions were answered to the patient's satisfaction.     Raylen Ken A

## 2012-04-28 NOTE — Progress Notes (Signed)
Report to Phillip RN as caregiver 

## 2012-04-28 NOTE — Anesthesia Preprocedure Evaluation (Signed)
Anesthesia Evaluation  Patient identified by MRN, date of birth, ID band Patient awake    Reviewed: Allergy & Precautions, H&P , NPO status , Patient's Chart, lab work & pertinent test results  Airway Mallampati: II TM Distance: <3 FB Neck ROM: full    Dental  (+) Teeth Intact   Pulmonary neg pulmonary ROS,    Pulmonary exam normal       Cardiovascular negative cardio ROS      Neuro/Psych negative neurological ROS     GI/Hepatic negative GI ROS,   Endo/Other  negative endocrine ROS  Renal/GU negative Renal ROS  negative genitourinary   Musculoskeletal   Abdominal Normal abdominal exam  (+)   Peds  Hematology negative hematology ROS (+)   Anesthesia Other Findings   Reproductive/Obstetrics                           Anesthesia Physical Anesthesia Plan  ASA: I  Anesthesia Plan: General LMA   Post-op Pain Management:    Induction:   Airway Management Planned:   Additional Equipment:   Intra-op Plan:   Post-operative Plan:   Informed Consent: I have reviewed the patients History and Physical, chart, labs and discussed the procedure including the risks, benefits and alternatives for the proposed anesthesia with the patient or authorized representative who has indicated his/her understanding and acceptance.   Dental Advisory Given  Plan Discussed with: Anesthesiologist, CRNA and Surgeon  Anesthesia Plan Comments:         Anesthesia Quick Evaluation

## 2012-04-28 NOTE — Transfer of Care (Signed)
Immediate Anesthesia Transfer of Care Note  Patient: [name]  Procedure(s) Performed: Procedure(s) (LRB) with comments: EXCISION MASS (N/A)  Patient Location: PACU  Anesthesia Type:General  Level of Consciousness: awake, alert , oriented and patient cooperative  Airway & Oxygen Therapy: Patient Spontanous Breathing and Patient connected to nasal cannula oxygen  Post-op Assessment: Report given to PACU RN, Post -op Vital signs reviewed and stable and Patient moving all extremities X 4  Post vital signs: Reviewed and stable  Complications: No apparent anesthesia complications

## 2012-04-28 NOTE — Anesthesia Procedure Notes (Signed)
Procedure Name: LMA Insertion Date/Time: 04/28/2012 12:29 PM Performed by: Angelica Pou Pre-anesthesia Checklist: Patient identified, Timeout performed, Emergency Drugs available, Suction available and Patient being monitored Patient Re-evaluated:Patient Re-evaluated prior to inductionOxygen Delivery Method: Circle system utilized Preoxygenation: Pre-oxygenation with 100% oxygen Intubation Type: IV induction Ventilation: Mask ventilation without difficulty LMA: LMA inserted LMA Size: 4.0 Number of attempts: 2 Placement Confirmation: breath sounds checked- equal and bilateral and positive ETCO2 Tube secured with: Tape Dental Injury: Teeth and Oropharynx as per pre-operative assessment  Comments: Attempt x1 by paramedic student, Lizzy, difficulty seating.  Attempt x1 by CRNA, LMA 4 Supreme easily seated.

## 2012-04-28 NOTE — Op Note (Signed)
EXCISION MASS  Procedure Note  [name] 04/28/2012   Pre-op Diagnosis: Abdominal wall mass     Post-op Diagnosis: same  Procedure(s): EXCISION 5 cm ABDOMINAL WALL MASS  Surgeon(s): Shelly Rubenstein, MD  Anesthesia: General  Staff:  Ursula Beath, RN - Circulator Doy Mince, RN - Scrub Person Gerre Pebbles Sipsis, RN - Relief Circulator Ursula Beath, RN - Circulator Assistant  Estimated Blood Loss: Minimal               Specimens: mass sent to path  Findings: The patient was found to have a 5 cm mass at the midline underneath her old C-section incision. It is suspected to be an endometrioma. It was sent to pathology for evaluation  Procedure: The patient was brought to the operating room and identified as the correct patient. She was placed supine on the operating room table and general anesthesia was induced. Her abdomen was then prepped and draped in the usual sterile fashion. The skin around the mass was anesthetized with lidocaine. I made an elliptical incision including the previous scar biopsy site with a scalpel. I took this down into the subcutaneous tissue with the electrocautery. I then did a wide excision of the mass going down to the fascia. The fascia was thinned out but not violated completely. The hard 5 cm mass was excised in its entirety with the electrocautery. It was then sent to pathology for evaluation. I then reinforced the fascia with figure-of-eight #1 Novafil sutures. I irrigated the wound with saline. Hemostasis was achieved with cautery. I anesthetized the fascia and surrounding tissue further with Marcaine. I then closed the subcutaneous tissue with interrupted 3-0 Vicryl sutures and closed the skin with a running 4-0 Monocryl. Steri-Strips, gauze, and tape were then applied. The patient tolerated the procedure well. All counts were correct at the end of the procedure. The patient was then extubated in the operating room and taken  in a stable condition to the recovery room.          Bryant Saye A   Date: 04/28/2012  Time: 1:07 PM

## 2012-04-28 NOTE — Preoperative (Signed)
Beta Blockers   Reason not to administer Beta Blockers:Not Applicable 

## 2012-04-29 ENCOUNTER — Encounter (HOSPITAL_COMMUNITY): Payer: Self-pay | Admitting: Surgery

## 2012-05-17 ENCOUNTER — Ambulatory Visit (INDEPENDENT_AMBULATORY_CARE_PROVIDER_SITE_OTHER): Payer: BC Managed Care – PPO | Admitting: Surgery

## 2012-05-17 ENCOUNTER — Encounter (INDEPENDENT_AMBULATORY_CARE_PROVIDER_SITE_OTHER): Payer: Self-pay | Admitting: Surgery

## 2012-05-17 VITALS — BP 122/62 | HR 64 | Temp 97.4°F | Resp 12 | Ht 60.0 in | Wt 141.0 lb

## 2012-05-17 DIAGNOSIS — Z09 Encounter for follow-up examination after completed treatment for conditions other than malignant neoplasm: Secondary | ICD-10-CM

## 2012-05-17 NOTE — Progress Notes (Signed)
Subjective:     Patient ID: [name], female   DOB: [birth year], 36 y.o.   MRN: [id number]  HPI She is here for her first postoperative visit status post excision of an abdominal wall mass. She is doing well and has no complaints  Review of Systems     Objective:   Physical Exam Her incision is well-healed without evidence of infection or seroma The final pathology showed an endometrioma    Assessment:     Patient stable postop    Plan:     She may resume her normal activity. She will resume lifting heavy objects next week. I will see her back as needed

## 2013-01-13 LAB — OB RESULTS CONSOLE ABO/RH: RH Type: POSITIVE

## 2013-01-13 LAB — OB RESULTS CONSOLE ANTIBODY SCREEN: Antibody Screen: NEGATIVE

## 2013-01-13 LAB — OB RESULTS CONSOLE RUBELLA ANTIBODY, IGM: RUBELLA: IMMUNE

## 2013-01-13 LAB — OB RESULTS CONSOLE HEPATITIS B SURFACE ANTIGEN: Hepatitis B Surface Ag: NEGATIVE

## 2013-01-13 LAB — OB RESULTS CONSOLE HIV ANTIBODY (ROUTINE TESTING): HIV: NONREACTIVE

## 2013-01-13 LAB — OB RESULTS CONSOLE RPR: RPR: NONREACTIVE

## 2013-01-20 ENCOUNTER — Inpatient Hospital Stay (HOSPITAL_COMMUNITY): Admission: AD | Admit: 2013-01-20 | Payer: Self-pay | Source: Ambulatory Visit | Admitting: Obstetrics and Gynecology

## 2013-08-01 ENCOUNTER — Encounter (HOSPITAL_COMMUNITY): Payer: Self-pay | Admitting: Pharmacist

## 2013-08-12 ENCOUNTER — Encounter (HOSPITAL_COMMUNITY): Payer: Self-pay

## 2013-08-15 ENCOUNTER — Encounter (HOSPITAL_COMMUNITY)
Admission: RE | Admit: 2013-08-15 | Discharge: 2013-08-15 | Disposition: A | Discharge status: Home / Self Care | Payer: 59 | Source: Ambulatory Visit | Attending: Obstetrics and Gynecology | Admitting: Obstetrics and Gynecology

## 2013-08-15 ENCOUNTER — Encounter (HOSPITAL_COMMUNITY): Payer: Self-pay

## 2013-08-15 LAB — TYPE AND SCREEN
ABO/RH(D): O POS
ANTIBODY SCREEN: NEGATIVE

## 2013-08-15 LAB — ABO/RH: ABO/RH(D): O POS

## 2013-08-15 LAB — CBC
HEMATOCRIT: 33.7 % — AB (ref 36.0–46.0)
Hemoglobin: 11.2 g/dL — ABNORMAL LOW (ref 12.0–15.0)
MCH: 28.3 pg (ref 26.0–34.0)
MCHC: 33.2 g/dL (ref 30.0–36.0)
MCV: 85.1 fL (ref 78.0–100.0)
Platelets: 282 10*3/uL (ref 150–400)
RBC: 3.96 MIL/uL (ref 3.87–5.11)
RDW: 14.1 % (ref 11.5–15.5)
WBC: 8.9 10*3/uL (ref 4.0–10.5)

## 2013-08-15 LAB — RPR: RPR Ser Ql: NONREACTIVE

## 2013-08-15 NOTE — H&P (Addendum)
38 yo G3P2001 @ 39 wks presents for c-section and tubal ligation  PMHx:  Desmoid tumor, h/o IUFD PSHx: c-section, resection of desmoid tumor from c-section scar SHx: no tobacco All:  PCN, sulfa Meds:  PNV FHx:  N/c  AF, VSS Gen - NAD CV - RRR Lungs - clear Abd - gravid, NT Ext - NT  A/P:  Prior c-section, declines TOL.  Desires permanent sterilization Repeat c-section, BPS R/b/a discussed, questions answered, informed consent

## 2013-08-15 NOTE — Patient Instructions (Signed)
[place]  08/15/2013   Your procedure is scheduled on:  08/16/13  Enter through the Main Entrance of [facility] up the phone at the desk and dial [phone].   Call this number if you have problems the morning of surgery: [phone]   Remember:   Do not eat food:After Midnight.  Do not drink clear liquids: 4 Hours before arrival.  Take these medicines the morning of surgery with A SIP OF WATER: NA   Do not wear jewelry, make-up or nail polish.  Do not wear lotions, powders, or perfumes. You may wear deodorant.  Do not shave 24 hours prior to surgery.  Do not bring valuables to the hospital.  [facility] is not   responsible for any belongings or valuables brought to the hospital.  Contacts, dentures or bridgework may not be worn into surgery.  Leave suitcase in the car. After surgery it may be brought to your room.  For patients admitted to the hospital, checkout time is 11:00 AM the day of              discharge.   Patients discharged the day of surgery will not be allowed to drive             home.  Name and phone number of your driver: NA  Special Instructions:      Please read over the following fact sheets that you were given:   Surgical Site Infection Prevention

## 2013-08-16 ENCOUNTER — Inpatient Hospital Stay (HOSPITAL_COMMUNITY)
Admission: RE | Admit: 2013-08-16 | Discharge: 2013-08-18 | DRG: 745 | Disposition: A | Discharge status: Home / Self Care | Payer: 59 | Source: Ambulatory Visit | Attending: Obstetrics and Gynecology | Admitting: Obstetrics and Gynecology

## 2013-08-16 ENCOUNTER — Encounter (HOSPITAL_COMMUNITY)
Admission: RE | Disposition: A | Discharge status: Home / Self Care | Payer: Self-pay | Source: Ambulatory Visit | Attending: Obstetrics and Gynecology

## 2013-08-16 ENCOUNTER — Encounter (HOSPITAL_COMMUNITY): Payer: Self-pay | Admitting: *Deleted

## 2013-08-16 ENCOUNTER — Encounter (HOSPITAL_COMMUNITY): Payer: 59 | Admitting: Anesthesiology

## 2013-08-16 ENCOUNTER — Inpatient Hospital Stay (HOSPITAL_COMMUNITY): Payer: 59 | Admitting: Anesthesiology

## 2013-08-16 DIAGNOSIS — Z98891 History of uterine scar from previous surgery: Secondary | ICD-10-CM

## 2013-08-16 DIAGNOSIS — O34219 Maternal care for unspecified type scar from previous cesarean delivery: Secondary | ICD-10-CM | POA: Diagnosis present

## 2013-08-16 DIAGNOSIS — O09529 Supervision of elderly multigravida, unspecified trimester: Secondary | ICD-10-CM | POA: Diagnosis present

## 2013-08-16 DIAGNOSIS — Z302 Encounter for sterilization: Principal | ICD-10-CM

## 2013-08-16 SURGERY — Surgical Case
Anesthesia: Spinal | Laterality: Bilateral

## 2013-08-16 MED ORDER — OXYTOCIN 10 UNIT/ML IJ SOLN
INTRAMUSCULAR | Status: AC
  Filled 2013-08-16: qty 4

## 2013-08-16 MED ORDER — OXYCODONE-ACETAMINOPHEN 5-325 MG PO TABS
1.0000 | ORAL_TABLET | ORAL | Status: DC | PRN
  Administered 2013-08-17: 1 via ORAL
  Administered 2013-08-18: 2 via ORAL
  Filled 2013-08-16: qty 1
  Filled 2013-08-16: qty 2

## 2013-08-16 MED ORDER — ONDANSETRON HCL 4 MG/2ML IJ SOLN
INTRAMUSCULAR | Status: AC
  Filled 2013-08-16: qty 2

## 2013-08-16 MED ORDER — NALOXONE HCL 1 MG/ML IJ SOLN: 1.0000 ug/kg/h | INTRAVENOUS | Status: DC | PRN

## 2013-08-16 MED ORDER — DIPHENHYDRAMINE HCL 50 MG/ML IJ SOLN: 12.5000 mg | INTRAMUSCULAR | Status: DC | PRN

## 2013-08-16 MED ORDER — SIMETHICONE 80 MG PO CHEW
80.0000 mg | CHEWABLE_TABLET | ORAL | Status: DC
  Administered 2013-08-16 – 2013-08-17 (×2): 80 mg via ORAL
  Filled 2013-08-16 (×2): qty 1

## 2013-08-16 MED ORDER — SENNOSIDES-DOCUSATE SODIUM 8.6-50 MG PO TABS
2.0000 | ORAL_TABLET | ORAL | Status: DC
  Administered 2013-08-16 – 2013-08-17 (×2): 2 via ORAL
  Filled 2013-08-16 (×2): qty 2

## 2013-08-16 MED ORDER — SODIUM CHLORIDE 0.9 % IJ SOLN: 3.0000 mL | INTRAMUSCULAR | Status: DC | PRN

## 2013-08-16 MED ORDER — ONDANSETRON HCL 4 MG PO TABS: 4.0000 mg | ORAL_TABLET | ORAL | Status: DC | PRN

## 2013-08-16 MED ORDER — DIPHENHYDRAMINE HCL 50 MG/ML IJ SOLN: 25.0000 mg | INTRAMUSCULAR | Status: DC | PRN

## 2013-08-16 MED ORDER — PHENYLEPHRINE 8 MG IN D5W 100 ML (0.08MG/ML) PREMIX OPTIME
INJECTION | INTRAVENOUS | Status: AC
  Filled 2013-08-16: qty 100

## 2013-08-16 MED ORDER — ONDANSETRON HCL 4 MG/2ML IJ SOLN
INTRAMUSCULAR | Status: DC | PRN
  Administered 2013-08-16: 4 mg via INTRAVENOUS

## 2013-08-16 MED ORDER — MEASLES, MUMPS & RUBELLA VAC ~~LOC~~ INJ: 0.5000 mL | INJECTION | Freq: Once | SUBCUTANEOUS | Status: DC

## 2013-08-16 MED ORDER — OXYTOCIN 10 UNIT/ML IJ SOLN
40.0000 [IU] | INTRAVENOUS | Status: DC | PRN
  Administered 2013-08-16: 40 [IU] via INTRAVENOUS

## 2013-08-16 MED ORDER — LACTATED RINGERS IV SOLN
INTRAVENOUS | Status: DC | PRN
  Administered 2013-08-16 (×2): via INTRAVENOUS

## 2013-08-16 MED ORDER — PHENYLEPHRINE 8 MG IN D5W 100 ML (0.08MG/ML) PREMIX OPTIME
INJECTION | INTRAVENOUS | Status: DC | PRN
  Administered 2013-08-16: 60 ug/min via INTRAVENOUS

## 2013-08-16 MED ORDER — DIBUCAINE 1 % RE OINT: 1.0000 "application " | TOPICAL_OINTMENT | RECTAL | Status: DC | PRN

## 2013-08-16 MED ORDER — FENTANYL CITRATE 0.05 MG/ML IJ SOLN
INTRAMUSCULAR | Status: AC
  Filled 2013-08-16: qty 2

## 2013-08-16 MED ORDER — LACTATED RINGERS IV SOLN: INTRAVENOUS | Status: DC

## 2013-08-16 MED ORDER — WITCH HAZEL-GLYCERIN EX PADS: 1.0000 "application " | MEDICATED_PAD | CUTANEOUS | Status: DC | PRN

## 2013-08-16 MED ORDER — LACTATED RINGERS IV SOLN
Freq: Once | INTRAVENOUS | Status: DC
Start: 2013-08-16 — End: 2013-08-16

## 2013-08-16 MED ORDER — ONDANSETRON HCL 4 MG/2ML IJ SOLN
4.0000 mg | Freq: Three times a day (TID) | INTRAMUSCULAR | Status: DC | PRN
Start: 2013-08-16 — End: 2013-08-18

## 2013-08-16 MED ORDER — NALBUPHINE HCL 10 MG/ML IJ SOLN: 5.0000 mg | INTRAMUSCULAR | Status: DC | PRN

## 2013-08-16 MED ORDER — KETOROLAC TROMETHAMINE 30 MG/ML IJ SOLN: 30.0000 mg | Freq: Four times a day (QID) | INTRAMUSCULAR | Status: AC | PRN

## 2013-08-16 MED ORDER — SIMETHICONE 80 MG PO CHEW
80.0000 mg | CHEWABLE_TABLET | Freq: Three times a day (TID) | ORAL | Status: DC
  Administered 2013-08-17 – 2013-08-18 (×4): 80 mg via ORAL
  Filled 2013-08-16 (×4): qty 1

## 2013-08-16 MED ORDER — MEDROXYPROGESTERONE ACETATE 150 MG/ML IM SUSP: 150.0000 mg | INTRAMUSCULAR | Status: DC | PRN

## 2013-08-16 MED ORDER — TETANUS-DIPHTH-ACELL PERTUSSIS 5-2.5-18.5 LF-MCG/0.5 IM SUSP: 0.5000 mL | Freq: Once | INTRAMUSCULAR | Status: DC

## 2013-08-16 MED ORDER — HYDROMORPHONE HCL PF 1 MG/ML IJ SOLN: 0.2500 mg | INTRAMUSCULAR | Status: DC | PRN

## 2013-08-16 MED ORDER — MORPHINE SULFATE 0.5 MG/ML IJ SOLN
INTRAMUSCULAR | Status: AC
  Filled 2013-08-16: qty 10

## 2013-08-16 MED ORDER — LACTATED RINGERS IV SOLN
INTRAVENOUS | Status: DC
  Administered 2013-08-16: 12:00:00 via INTRAVENOUS

## 2013-08-16 MED ORDER — IBUPROFEN 600 MG PO TABS
600.0000 mg | ORAL_TABLET | Freq: Four times a day (QID) | ORAL | Status: DC
  Administered 2013-08-17 – 2013-08-18 (×5): 600 mg via ORAL
  Filled 2013-08-16 (×5): qty 1

## 2013-08-16 MED ORDER — MENTHOL 3 MG MT LOZG: 1.0000 | LOZENGE | OROMUCOSAL | Status: DC | PRN

## 2013-08-16 MED ORDER — DIPHENHYDRAMINE HCL 25 MG PO CAPS: 25.0000 mg | ORAL_CAPSULE | ORAL | Status: DC | PRN

## 2013-08-16 MED ORDER — MORPHINE SULFATE (PF) 0.5 MG/ML IJ SOLN
INTRAMUSCULAR | Status: DC | PRN
  Administered 2013-08-16: .15 mg via INTRATHECAL

## 2013-08-16 MED ORDER — LANOLIN HYDROUS EX OINT: 1.0000 "application " | TOPICAL_OINTMENT | CUTANEOUS | Status: DC | PRN

## 2013-08-16 MED ORDER — NALOXONE HCL 0.4 MG/ML IJ SOLN: 0.4000 mg | INTRAMUSCULAR | Status: DC | PRN

## 2013-08-16 MED ORDER — CLINDAMYCIN PHOSPHATE 900 MG/50ML IV SOLN
900.0000 mg | Freq: Once | INTRAVENOUS | Status: AC
  Administered 2013-08-16: 900 mg via INTRAVENOUS
  Filled 2013-08-16: qty 50

## 2013-08-16 MED ORDER — SCOPOLAMINE 1 MG/3DAYS TD PT72
MEDICATED_PATCH | TRANSDERMAL | Status: AC
  Filled 2013-08-16: qty 1

## 2013-08-16 MED ORDER — FENTANYL CITRATE 0.05 MG/ML IJ SOLN
INTRAMUSCULAR | Status: DC | PRN
  Administered 2013-08-16: 25 ug via INTRATHECAL

## 2013-08-16 MED ORDER — OXYTOCIN 40 UNITS IN LACTATED RINGERS INFUSION - SIMPLE MED: 62.5000 mL/h | INTRAVENOUS | Status: AC

## 2013-08-16 MED ORDER — ONDANSETRON HCL 4 MG/2ML IJ SOLN: 4.0000 mg | INTRAMUSCULAR | Status: DC | PRN

## 2013-08-16 MED ORDER — SCOPOLAMINE 1 MG/3DAYS TD PT72
1.0000 | MEDICATED_PATCH | Freq: Once | TRANSDERMAL | Status: DC
  Administered 2013-08-16: 1.5 mg via TRANSDERMAL

## 2013-08-16 MED ORDER — NALBUPHINE HCL 10 MG/ML IJ SOLN
5.0000 mg | INTRAMUSCULAR | Status: DC | PRN
Start: 2013-08-16 — End: 2013-08-18

## 2013-08-16 MED ORDER — DEXTROSE IN LACTATED RINGERS 5 % IV SOLN: INTRAVENOUS | Status: DC

## 2013-08-16 MED ORDER — PRENATAL MULTIVITAMIN CH
1.0000 | ORAL_TABLET | Freq: Every day | ORAL | Status: DC
  Administered 2013-08-17 – 2013-08-18 (×2): 1 via ORAL
  Filled 2013-08-16 (×2): qty 1

## 2013-08-16 MED ORDER — SCOPOLAMINE 1 MG/3DAYS TD PT72: 1.0000 | MEDICATED_PATCH | Freq: Once | TRANSDERMAL | Status: DC

## 2013-08-16 MED ORDER — DIPHENHYDRAMINE HCL 25 MG PO CAPS: 25.0000 mg | ORAL_CAPSULE | Freq: Four times a day (QID) | ORAL | Status: DC | PRN

## 2013-08-16 MED ORDER — KETOROLAC TROMETHAMINE 30 MG/ML IJ SOLN
30.0000 mg | Freq: Four times a day (QID) | INTRAMUSCULAR | Status: AC | PRN
  Administered 2013-08-16 – 2013-08-17 (×3): 30 mg via INTRAVENOUS
  Filled 2013-08-16 (×2): qty 1

## 2013-08-16 MED ORDER — METOCLOPRAMIDE HCL 5 MG/ML IJ SOLN: 10.0000 mg | Freq: Three times a day (TID) | INTRAMUSCULAR | Status: DC | PRN

## 2013-08-16 MED ORDER — MEPERIDINE HCL 25 MG/ML IJ SOLN: 6.2500 mg | INTRAMUSCULAR | Status: DC | PRN

## 2013-08-16 MED ORDER — KETOROLAC TROMETHAMINE 30 MG/ML IJ SOLN
INTRAMUSCULAR | Status: AC
  Filled 2013-08-16: qty 1

## 2013-08-16 MED ORDER — SIMETHICONE 80 MG PO CHEW: 80.0000 mg | CHEWABLE_TABLET | ORAL | Status: DC | PRN

## 2013-08-16 SURGICAL SUPPLY — 36 items
ADH SKN CLS APL DERMABOND .7 (GAUZE/BANDAGES/DRESSINGS) ×1
ADH SKN CLS LQ APL DERMABOND (GAUZE/BANDAGES/DRESSINGS) ×1
CLAMP CORD UMBIL (MISCELLANEOUS) IMPLANT
CLOTH BEACON ORANGE TIMEOUT ST (SAFETY) ×3 IMPLANT
DERMABOND ADHESIVE PROPEN (GAUZE/BANDAGES/DRESSINGS) ×2
DERMABOND ADVANCED (GAUZE/BANDAGES/DRESSINGS) ×2
DERMABOND ADVANCED .7 DNX12 (GAUZE/BANDAGES/DRESSINGS) ×1 IMPLANT
DERMABOND ADVANCED .7 DNX6 (GAUZE/BANDAGES/DRESSINGS) IMPLANT
DRAPE LG THREE QUARTER DISP (DRAPES) IMPLANT
DRSG OPSITE POSTOP 4X10 (GAUZE/BANDAGES/DRESSINGS) ×3 IMPLANT
DURAPREP 26ML APPLICATOR (WOUND CARE) ×3 IMPLANT
ELECT REM PT RETURN 9FT ADLT (ELECTROSURGICAL) ×3
ELECTRODE REM PT RTRN 9FT ADLT (ELECTROSURGICAL) ×1 IMPLANT
EXTRACTOR VACUUM M CUP 4 TUBE (SUCTIONS) IMPLANT
EXTRACTOR VACUUM M CUP 4' TUBE (SUCTIONS)
GLOVE BIO SURGEON STRL SZ 6.5 (GLOVE) ×2 IMPLANT
GLOVE BIO SURGEONS STRL SZ 6.5 (GLOVE) ×1
GLOVE BIOGEL PI IND STRL 7.0 (GLOVE) ×1 IMPLANT
GLOVE BIOGEL PI INDICATOR 7.0 (GLOVE) ×2
GOWN STRL REUS W/TWL LRG LVL3 (GOWN DISPOSABLE) ×6 IMPLANT
KIT ABG SYR 3ML LUER SLIP (SYRINGE) IMPLANT
NDL HYPO 25X5/8 SAFETYGLIDE (NEEDLE) IMPLANT
NEEDLE HYPO 25X5/8 SAFETYGLIDE (NEEDLE) IMPLANT
NS IRRIG 1000ML POUR BTL (IV SOLUTION) ×3 IMPLANT
PACK C SECTION WH (CUSTOM PROCEDURE TRAY) ×3 IMPLANT
PAD OB MATERNITY 4.3X12.25 (PERSONAL CARE ITEMS) ×3 IMPLANT
STAPLER VISISTAT 35W (STAPLE) IMPLANT
SUT CHROMIC 0 CT 802H (SUTURE) IMPLANT
SUT CHROMIC 0 CTX 36 (SUTURE) ×9 IMPLANT
SUT MON AB-0 CT1 36 (SUTURE) ×3 IMPLANT
SUT PDS AB 0 CTX 60 (SUTURE) ×3 IMPLANT
SUT PLAIN 0 NONE (SUTURE) IMPLANT
SUT VIC AB 4-0 KS 27 (SUTURE) IMPLANT
TOWEL OR 17X24 6PK STRL BLUE (TOWEL DISPOSABLE) ×3 IMPLANT
TRAY FOLEY CATH 14FR (SET/KITS/TRAYS/PACK) IMPLANT
WATER STERILE IRR 1000ML POUR (IV SOLUTION) ×3 IMPLANT

## 2013-08-16 NOTE — Transfer of Care (Signed)
Immediate Anesthesia Transfer of Care Note  Patient: [name]  Procedure(s) Performed: Procedure(s) with comments: CESAREAN SECTION WITH BILATERAL TUBAL LIGATION (Bilateral) - REPEAT   Patient Location: PACU  Anesthesia Type:Spinal  Level of Consciousness: awake, alert  and oriented  Airway & Oxygen Therapy: Patient Spontanous Breathing  Post-op Assessment: Report given to PACU RN and Post -op Vital signs reviewed and stable  Post vital signs: Reviewed and stable  Complications: none

## 2013-08-16 NOTE — Anesthesia Postprocedure Evaluation (Signed)
  Anesthesia Post-op Note  Patient: [name]  Procedure(s) Performed: Procedure(s) with comments: CESAREAN SECTION WITH BILATERAL TUBAL LIGATION (Bilateral) - REPEAT   Patient is awake, responsive, moving her legs, and has signs of resolution of her numbness. Pain and nausea are reasonably well controlled. Vital signs are stable and clinically acceptable. Oxygen saturation is clinically acceptable. There are no apparent anesthetic complications at this time. Patient is ready for discharge.

## 2013-08-16 NOTE — Anesthesia Procedure Notes (Signed)

## 2013-08-16 NOTE — Op Note (Signed)
Cesarean Section Procedure Note   [name]  08/16/2013  Indications: Scheduled Proceedure/Maternal Request , desires sterilization  Pre-operative Diagnosis: PREVIOUS X 1, desires sterilization [facility] 08/22/13.   Post-operative Diagnosis: Same   Surgeon: Surgeon(s) and Role:    * Marylynn Pearson, MD - Primary   Assistants: none  Anesthesia: spinal   Procedure Details:  The patient was seen in the Holding Room. The risks, benefits, complications, treatment options, and expected outcomes were discussed with the patient. The patient concurred with the proposed plan, giving informed consent. identified as [name] and the procedure verified as C-Section Delivery. A Time Out was held and the above information confirmed.  After induction of anesthesia, the patient was draped and prepped in the usual sterile manner. A transverse was made and carried down through the subcutaneous tissue to the fascia. Fascial incision was made and extended transversely. The fascia was separated from the underlying rectus tissue superiorly and inferiorly. The peritoneum was identified and entered. Peritoneal incision was extended longitudinally. The utero-vesical peritoneal reflection was incised transversely and the bladder flap was bluntly freed from the lower uterine segment. A low transverse uterine incision was made. Delivered from cephalic presentation was a vigerous infant. Cord ph was not sent the umbilical cord was clamped and cut cord blood was obtained for evaluation. The placenta was removed Intact and appeared normal. The uterine outline, tubes and ovaries appeared normal}. The uterine incision was closed with running locked sutures of 0plain gut.   Hemostasis was observed. Lavage was carried out until clear. Peritoneum was closed with 0 monocryl. The fascia was then reapproximated with running sutures of 0PDS. The skin was closed with 4-0Vicryl.   Instrument, sponge, and needle counts were correct  prior the abdominal closure and were correct at the conclusion of the case.     Estimated Blood Loss: 600cc   Urine Output: clear  Specimens: @ORSPECIMEN @   Complications: no complications  Disposition: PACU - hemodynamically stable.   Maternal Condition: stable   Baby condition / location:  Couplet care / Skin to Skin  Attending Attestation: I was present and scrubbed for the entire procedure.   Signed: Surgeon(s): Marylynn Pearson, MD

## 2013-08-16 NOTE — Anesthesia Preprocedure Evaluation (Addendum)

## 2013-08-16 NOTE — Lactation Note (Signed)
This note was copied from the chart of [place]. Lactation Consultation Note  Patient Name: [name] [id number] Date: 08/16/2013 Reason for consult: Initial assessment of this second-time mom and her newborn at 19 hours after birth.  Mom has flat nipples which are slightly dimpled in center. Mom tried breastfeeding her older child, now 40 yo but he was unable to latch and so she pumped and fed ebm for 2 months. She was seen by [facility] and used NS and shells with this first child, stating she is familiar with thier use.  LC provided hand pump, shells for inverted nipples and #20 and #24 NS for mom to try at next feeding, since baby too sleepy at this time.  LC demonstrated how to apply NS and suggested trying #20 first but use #24 if the smaller NS pinches.  Mom has personal pump which she will bring in for testing of suction. For now, [facility] encouraged frequent STS and cue feedings, using hand pump prior to latch and/or NS to ensure a deep latch to breast.  Baby has had 2 breastfeeding attempts thus far with LATCH scores of 5 per RN assessment.  Mom has expressible colostrum but says her breasts feel softer than they did with her first child. LC encouraged review of Baby and Me pp 9, 14 and 20-25 for STS and BF information. LC provided [employer] brochure and reviewed [facility] services and list of community and web site resources.     Maternal Data Formula Feeding for Exclusion: No Infant to breast within first hour of birth: Yes (LATCH score=5) Has patient been taught Hand Expression?: Yes Does the patient have breastfeeding experience prior to this delivery?: Yes  Feeding    LATCH Score/Interventions           LATCH score-5 after delivery and baby sleepy thus far but has had several attempts and STS           Lactation Tools Discussed/Used Pump Review: Setup, frequency, and cleaning;Milk Storage Initiated by:: Junious Dresser, RN, IBCLC Date initiated:: 08/16/13 Shells for inverted  nipples #20 and #24 NS Hand pump  Consult Status Consult Status: Follow-up Date: 08/17/13 Follow-up type: In-patient    Junious Dresser [facility] 08/16/2013, 9:48 PM

## 2013-08-17 ENCOUNTER — Encounter (HOSPITAL_COMMUNITY): Payer: Self-pay | Admitting: Obstetrics and Gynecology

## 2013-08-17 LAB — CBC
HCT: 27.2 % — ABNORMAL LOW (ref 36.0–46.0)
Hemoglobin: 9 g/dL — ABNORMAL LOW (ref 12.0–15.0)
MCH: 28.3 pg (ref 26.0–34.0)
MCHC: 33.1 g/dL (ref 30.0–36.0)
MCV: 85.5 fL (ref 78.0–100.0)
Platelets: 235 10*3/uL (ref 150–400)
RBC: 3.18 MIL/uL — AB (ref 3.87–5.11)
RDW: 14.3 % (ref 11.5–15.5)
WBC: 13 10*3/uL — ABNORMAL HIGH (ref 4.0–10.5)

## 2013-08-17 LAB — BIRTH TISSUE RECOVERY COLLECTION (PLACENTA DONATION)

## 2013-08-17 NOTE — Progress Notes (Signed)
Patient was referred for history of depression/anxiety.  * Referral screened out by Clinical Social Worker because none of the following criteria appear to apply:  ~ History of anxiety/depression during this pregnancy, or of post-partum depression.  ~ Diagnosis of anxiety and/or depression within last 3 years  ~ History of depression due to pregnancy loss/loss of child  OR  * Patient's symptoms currently being treated with medication and/or therapy.  Please contact the Clinical Social Worker if needs arise, or by the patient's request.  Pt does not have history of anxiety, per chart review.

## 2013-08-17 NOTE — Addendum Note (Signed)
Addendum created 08/17/13 0802 by Talbot Grumbling, CRNA   Modules edited: Notes Section   Notes Section:  File: [id number]

## 2013-08-17 NOTE — Progress Notes (Signed)
Subjective: Postpartum Day 1: Cesarean Delivery Patient reports tolerating PO and no problems voiding.    Objective: Vital signs in last 24 hours: Temp:  [97.4 F (36.3 C)-98.4 F (36.9 C)] 97.7 F (36.5 C) (02/25 0450) Pulse Rate:  [50-81] 68 (02/25 0450) Resp:  [16-20] 18 (02/25 0450) BP: (94-137)/(61-95) 108/70 mmHg (02/25 0450) SpO2:  [95 %-100 %] 97 % (02/25 0450) Weight:  [81.194 kg (179 lb)] 81.194 kg (179 lb) (02/24 1518)  Physical Exam:  General: alert, cooperative and appears stated age 38: appropriate Uterine Fundus: firm Incision: healing well, no significant drainage, no dehiscence, no significant erythema DVT Evaluation: No evidence of DVT seen on physical exam.   Recent Labs  08/15/13 1010 08/17/13 0620  HGB 11.2* 9.0*  HCT 33.7* 27.2*    Assessment/Plan: Status post Cesarean section. Doing well postoperatively.  Continue current care.  [name] L 08/17/2013, 8:32 AM

## 2013-08-17 NOTE — Lactation Note (Signed)
This note was copied from the chart of [place]. Lactation Consultation Note Follow up consult:  Mother placed baby in football hold with NS with ease, sucks observed.  Colostrum in NS. Set up DEBP, reviewed cleaning and milk storage. Plan is for mother to post pump both breasts, 4x a day at least for 15 min during daytime hours. Give baby back whatever is pumped with syringe, sucking finger or foley cup.  Patient Name: [name] [id number] Date: 08/17/2013 Reason for consult: Follow-up assessment   Maternal Data    Feeding Feeding Type: Breast Fed Length of feed: 10 min (with nipple shield)  LATCH Score/Interventions Latch: Repeated attempts needed to sustain latch, nipple held in mouth throughout feeding, stimulation needed to elicit sucking reflex. Intervention(s): Adjust position  Audible Swallowing: Spontaneous and intermittent  Type of Nipple: Flat Intervention(s): Double electric pump  Comfort (Breast/Nipple): Soft / non-tender     Hold (Positioning): No assistance needed to correctly position infant at breast. Intervention(s): Breastfeeding basics reviewed  LATCH Score: 8  Lactation Tools Discussed/Used Tools: Nipple [employer] Fuel;Pump Nipple shield size: 20 Breast pump type: Double-Electric Breast Pump Pump Review: Setup, frequency, and cleaning;Milk Storage Initiated by:: Vivianne Master RN Date initiated:: 08/17/13   Consult Status Consult Status: Follow-up Date: 08/18/13 Follow-up type: In-patient    Vivianne Master [facility] 08/17/2013, 3:35 PM

## 2013-08-17 NOTE — Anesthesia Postprocedure Evaluation (Signed)
Anesthesia Post Note  Patient: [name]  Procedure(s) Performed: Procedure(s) (LRB): CESAREAN SECTION WITH BILATERAL TUBAL LIGATION (Bilateral)  Anesthesia type: SAB  Patient location: Mother/Baby  Post pain: Pain level controlled  Post assessment: Post-op Vital signs reviewed  Last Vitals:  Filed Vitals:   08/17/13 0450  BP: 108/70  Pulse: 68  Temp: 36.5 C  Resp: 18    Post vital signs: Reviewed  Level of consciousness: awake  Complications: No apparent anesthesia complications

## 2013-08-18 ENCOUNTER — Encounter (HOSPITAL_COMMUNITY): Payer: Self-pay | Admitting: *Deleted

## 2013-08-18 MED ORDER — FERROUS SULFATE 325 (65 FE) MG PO TABS: 325.0000 mg | ORAL_TABLET | Freq: Two times a day (BID) | ORAL | Status: DC

## 2013-08-18 MED ORDER — OXYCODONE-ACETAMINOPHEN 5-325 MG PO TABS: 1.0000 | ORAL_TABLET | ORAL | Status: DC | PRN

## 2013-08-18 MED ORDER — IBUPROFEN 600 MG PO TABS: 600.0000 mg | ORAL_TABLET | Freq: Four times a day (QID) | ORAL | Status: DC

## 2013-08-18 NOTE — Progress Notes (Signed)
Subjective: Postpartum Day 2: Cesarean Delivery Patient reports tolerating PO, + flatus and no problems voiding.  Requests circ today and early discharge.  Objective: Vital signs in last 24 hours: Temp:  [97.4 F (36.3 C)-98 F (36.7 C)] 97.4 F (36.3 C) (02/26 0637) Pulse Rate:  [66-91] 66 (02/26 0637) Resp:  [18-20] 18 (02/26 0637) BP: (107-135)/(78-83) 135/83 mmHg (02/26 8250) Weight:  [179 lb (81.194 kg)] 179 lb (81.194 kg) (02/25 1447)  Physical Exam:  General: alert, cooperative and appears stated age Lochia: appropriate Uterine Fundus: firm Incision: healing well, no significant drainage, no dehiscence DVT Evaluation: No evidence of DVT seen on physical exam. Negative Homan's sign. No cords or calf tenderness.   Recent Labs  08/17/13 0620  HGB 9.0*  HCT 27.2*    Assessment/Plan: Status post Cesarean section. Doing well postoperatively.  Discharge home with standard precautions and return to clinic in 4-6 weeks. Counseled re: r/b/a of circ.  Will do today.  [name] 08/18/2013, 10:19 AM

## 2013-08-18 NOTE — Discharge Instructions (Signed)
Call MD for T>100.4, severe abdominal pain, intractable nausea and/or vomiting, heavy vaginal bleeding, or respiratory distress.  Call office to schedule 1 week postop incision check.  No driving while taking narcotics.  No heavy lifting.  Pelvic rest x 6 weeks.

## 2013-08-18 NOTE — Discharge Summary (Signed)
Obstetric Discharge Summary Reason for Admission: cesarean section Prenatal Procedures: none Intrapartum Procedures: cesarean: low cervical, transverse and tubal ligation Postpartum Procedures: none Complications-Operative and Postpartum: none Hemoglobin  Date Value Ref Range Status  08/17/2013 9.0* 12.0 - 15.0 g/dL Final     DELTA CHECK NOTED     REPEATED TO VERIFY     HCT  Date Value Ref Range Status  08/17/2013 27.2* 36.0 - 46.0 % Final    Physical Exam:  General: alert, cooperative and appears stated age 38: appropriate Uterine Fundus: firm Incision: healing well, no significant drainage, no dehiscence DVT Evaluation: No evidence of DVT seen on physical exam. Negative Homan's sign. No cords or calf tenderness.  Discharge Diagnoses: Term Pregnancy-delivered  Discharge Information: Date: 08/18/2013 Activity: pelvic rest Diet: routine Medications: PNV, Ibuprofen and Percocet Condition: stable Instructions: refer to practice specific booklet Discharge to: home   Newborn Data: Live born female  Birth Weight: 7 lb 9 oz (3430 g) APGAR: 8, 9  Home with mother.  Rosalynn Sergent, [place] 08/18/2013, 10:24 AM

## 2014-04-24 ENCOUNTER — Encounter (HOSPITAL_COMMUNITY): Payer: Self-pay | Admitting: *Deleted

## 2016-10-17 DIAGNOSIS — J0100 Acute maxillary sinusitis, unspecified: Secondary | ICD-10-CM | POA: Diagnosis not present

## 2016-12-30 DIAGNOSIS — Z01419 Encounter for gynecological examination (general) (routine) without abnormal findings: Secondary | ICD-10-CM | POA: Diagnosis not present

## 2017-01-01 DIAGNOSIS — N926 Irregular menstruation, unspecified: Secondary | ICD-10-CM | POA: Diagnosis not present

## 2017-02-16 DIAGNOSIS — R062 Wheezing: Secondary | ICD-10-CM | POA: Diagnosis not present

## 2017-02-16 DIAGNOSIS — J209 Acute bronchitis, unspecified: Secondary | ICD-10-CM | POA: Diagnosis not present

## 2017-10-02 DIAGNOSIS — J209 Acute bronchitis, unspecified: Secondary | ICD-10-CM | POA: Diagnosis not present

## 2017-10-02 DIAGNOSIS — J329 Chronic sinusitis, unspecified: Secondary | ICD-10-CM | POA: Diagnosis not present

## 2017-12-23 DIAGNOSIS — R1084 Generalized abdominal pain: Secondary | ICD-10-CM | POA: Diagnosis not present

## 2018-01-06 DIAGNOSIS — Z131 Encounter for screening for diabetes mellitus: Secondary | ICD-10-CM | POA: Diagnosis not present

## 2018-01-06 DIAGNOSIS — K5900 Constipation, unspecified: Secondary | ICD-10-CM | POA: Diagnosis not present

## 2018-01-06 DIAGNOSIS — Z0184 Encounter for antibody response examination: Secondary | ICD-10-CM | POA: Diagnosis not present

## 2018-01-06 DIAGNOSIS — Z136 Encounter for screening for cardiovascular disorders: Secondary | ICD-10-CM | POA: Diagnosis not present

## 2018-01-27 DIAGNOSIS — D485 Neoplasm of uncertain behavior of skin: Secondary | ICD-10-CM | POA: Diagnosis not present

## 2018-01-27 DIAGNOSIS — D2262 Melanocytic nevi of left upper limb, including shoulder: Secondary | ICD-10-CM | POA: Diagnosis not present

## 2018-01-27 DIAGNOSIS — B078 Other viral warts: Secondary | ICD-10-CM | POA: Diagnosis not present

## 2018-01-27 DIAGNOSIS — L814 Other melanin hyperpigmentation: Secondary | ICD-10-CM | POA: Diagnosis not present

## 2018-01-27 DIAGNOSIS — C44619 Basal cell carcinoma of skin of left upper limb, including shoulder: Secondary | ICD-10-CM | POA: Diagnosis not present

## 2018-04-27 DIAGNOSIS — C44619 Basal cell carcinoma of skin of left upper limb, including shoulder: Secondary | ICD-10-CM | POA: Diagnosis not present

## 2019-01-28 ENCOUNTER — Emergency Department (HOSPITAL_COMMUNITY)
Admission: EM | Admit: 2019-01-28 | Discharge: 2019-01-28 | Disposition: A | Discharge status: Home / Self Care | Payer: 59 | Attending: Emergency Medicine | Admitting: Emergency Medicine

## 2019-01-28 ENCOUNTER — Encounter (HOSPITAL_COMMUNITY): Payer: Self-pay | Admitting: Emergency Medicine

## 2019-01-28 ENCOUNTER — Other Ambulatory Visit: Payer: Self-pay

## 2019-01-28 DIAGNOSIS — R0602 Shortness of breath: Secondary | ICD-10-CM | POA: Diagnosis not present

## 2019-01-28 DIAGNOSIS — R079 Chest pain, unspecified: Secondary | ICD-10-CM | POA: Insufficient documentation

## 2019-01-28 DIAGNOSIS — Z733 Stress, not elsewhere classified: Secondary | ICD-10-CM | POA: Diagnosis not present

## 2019-01-28 DIAGNOSIS — Z880 Allergy status to penicillin: Secondary | ICD-10-CM | POA: Diagnosis not present

## 2019-01-28 LAB — TROPONIN I (HIGH SENSITIVITY)
Troponin I (High Sensitivity): <2 ng/L (ref <18)
Troponin I (High Sensitivity): <2 ng/L (ref <18)

## 2019-01-28 LAB — CBC
HCT: 35.4 % — ABNORMAL LOW (ref 36.0–46.0)
Hemoglobin: 11.3 g/dL — ABNORMAL LOW (ref 12.0–15.0)
MCH: 29.4 pg (ref 26.0–34.0)
MCHC: 31.9 g/dL (ref 30.0–36.0)
MCV: 91.9 fL (ref 80.0–100.0)
Platelets: 403 10*3/uL — ABNORMAL HIGH (ref 150–400)
RBC: 3.85 MIL/uL — ABNORMAL LOW (ref 3.87–5.11)
RDW: 14 % (ref 11.5–15.5)
WBC: 6.2 10*3/uL (ref 4.0–10.5)
nRBC: 0 % (ref 0.0–0.2)

## 2019-01-28 LAB — BASIC METABOLIC PANEL
Anion gap: 8 (ref 5–15)
BUN: 7 mg/dL (ref 6–20)
CO2: 24 mmol/L (ref 22–32)
Calcium: 9 mg/dL (ref 8.9–10.3)
Chloride: 105 mmol/L (ref 98–111)
Creatinine, Ser: 0.78 mg/dL (ref 0.44–1.00)
GFR calc Af Amer: >60 mL/min (ref >60)
GFR calc non Af Amer: >60 mL/min (ref >60)
Glucose, Bld: 133 mg/dL — ABNORMAL HIGH (ref 70–99)
Potassium: 3.6 mmol/L (ref 3.5–5.1)
Sodium: 137 mmol/L (ref 135–145)

## 2019-01-28 LAB — I-STAT BETA HCG BLOOD, ED (MC, WL, AP ONLY): I-stat hCG, quantitative: <5 m[IU]/mL (ref <5)

## 2019-01-28 MED ORDER — SODIUM CHLORIDE 0.9% FLUSH: 3.0000 mL | Freq: Once | INTRAVENOUS | Status: DC

## 2019-01-28 NOTE — ED Notes (Signed)
Pt approached this tech stating that she would like to go home. I informed pt that a second blood draw was needed. Pt agreed to stay.

## 2019-01-28 NOTE — ED Triage Notes (Signed)
Pt here for eval of mid chest pressure with shortness of breath for about a week. States she has been winded with activity, feels like someone is standing on her chest.

## 2019-01-28 NOTE — Discharge Instructions (Addendum)
Today you were seen in the ER for your chest pain. We did an EKG and checked a blood enzyme and these tests indicate that you are not having a heart attack. It is important that you followup with your primary care provider and discuss the symptoms you have been having. Your provider may decide to perform a stress test to see if you have any cardiac issues. If it is determined that you are not having a cardiac problem, your PCP can also provide you with options that can help in managing anxiety.  Please call your doctor or return to the ER if you develop new or worsening pain or other concerning symptoms.  Thank you for allowing [place] to take care of you today

## 2019-01-28 NOTE — ED Provider Notes (Signed)
[facility] EMERGENCY DEPARTMENT Provider Note   CSN: [id number] Arrival date & time: 01/28/19  1458    History   Chief Complaint Chief Complaint  Patient presents with  . Chest Pain  . Shortness of Breath    HPI [name] is a 43 y.o. female with no significant past medical history who presents with a one-week history of shortness of breath and pressure, stabbing, nonradiating chest pain.  Patient reports that pain comes and goes and is worse in the morning.  Pain improves with sitting up but this worsens patients shortness of breath.  Not worsened by deep breaths.  Patient states that the symptoms are better when she is running and states she is able to maintain of a pace of 11-12  Min/miles.  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 Patient was tearful during interview due to the stress.     HPI  Past Medical History:  Diagnosis Date  . Dysrhythmia    PVC S  HOLTER MONITOR X ONE MONTH .Marland Kitchen ?? CAFFEINE  . Family history of anesthesia complication    MATERNAL G.MOTHER  SLOW TO WAKE UP DURING OPEN HEART SURGERY .     Patient Active Problem List   Diagnosis Date Noted  . S/P cesarean section 08/16/2013  . Abdominal wall mass 04/01/2012    Past Surgical History:  Procedure Laterality Date  . CESAREAN SECTION    . CESAREAN SECTION WITH BILATERAL TUBAL LIGATION Bilateral 08/16/2013   Procedure: CESAREAN SECTION WITH BILATERAL TUBAL LIGATION;  Surgeon: Marylynn Pearson, MD;  Location: [facility];  Service: Obstetrics;  Laterality: Bilateral;  REPEAT   . MASS EXCISION  04/28/2012   Procedure: EXCISION MASS;  Surgeon: Harl Bowie, MD;  Location: [facility];  Service: General;  Laterality: N/A;     OB History    Gravida  3   Para  3   Term  3   Preterm      AB      Living  2     SAB      TAB      Ectopic      Multiple      Live Births  1             Home Medications    Prior to Admission medications   Medication Sig Start Date End Date Taking? Authorizing Provider  acetaminophen (TYLENOL) 500 MG tablet Take 500 mg by mouth every 6 (six) hours as needed for headache (pain).   Yes [provider]  albuterol (VENTOLIN HFA) 108 (90 Base) MCG/ACT inhaler Inhale 2 puffs into the lungs every 6 (six) hours as needed for wheezing or shortness of breath.   Yes [provider]  ferrous sulfate 325 (65 FE) MG tablet Take 1 tablet (325 mg total) by mouth 2 (two) times daily with a meal. Patient not taking: Reported on 01/28/2019 08/18/13   Linda Hedges, DO  ibuprofen (ADVIL,MOTRIN) 600 MG tablet Take 1 tablet (600 mg total) by mouth every 6 (six) hours. Patient not taking: Reported on 01/28/2019 08/18/13   Linda Hedges, DO  oxyCODONE-acetaminophen (PERCOCET/ROXICET) 5-325 MG per tablet Take 1-2 tablets by mouth every 4 (four) hours as needed for severe pain (moderate - severe pain). Patient not taking: Reported on 01/28/2019 08/18/13   Linda Hedges, DO    Family History No family history on file.  Social History Social History  Tobacco Use  . Smoking status: Never Smoker  Substance Use Topics  . Alcohol use: No  . Drug use: No     Allergies   Penicillins and Sulfa antibiotics   Review of Systems Review of Systems  Constitutional: Negative for chills and fever.  Respiratory: Positive for cough. Negative for shortness of breath.   Cardiovascular: Positive for chest pain.  Gastrointestinal: Negative for nausea and vomiting.  Genitourinary: Negative for dysuria.  All other systems reviewed and are negative.    Physical Exam Updated Vital Signs BP (!) 148/68 (BP Location: Left Arm)   Pulse 72   Temp 98.2 F (36.8 C) (Oral)   Resp 14   LMP 01/22/2019   SpO2 100%   Physical Exam Constitutional:      Appearance: She is well-developed.  HENT:     Head: Normocephalic and atraumatic.  Neck:     Musculoskeletal:  Normal range of motion and neck supple.  Cardiovascular:     Rate and Rhythm: Normal rate and regular rhythm.     Heart sounds: No murmur. No friction rub. No gallop.   Pulmonary:     Effort: Pulmonary effort is normal.     Breath sounds: Normal breath sounds. No stridor. No wheezing or rhonchi.  Abdominal:     General: Abdomen is flat. Bowel sounds are normal.     Palpations: Abdomen is soft.  Musculoskeletal:        General: No swelling, tenderness or deformity.  Skin:    General: Skin is warm and dry.  Neurological:     Mental Status: She is alert.  Psychiatric:        Mood and Affect: Mood normal.        Behavior: Behavior normal.      ED Treatments / Results  Labs (all labs ordered are listed, but only abnormal results are displayed) Labs Reviewed  BASIC METABOLIC PANEL - Abnormal; Notable for the following components:      Result Value   Glucose, Bld 133 (*)    All other components within normal limits  CBC - Abnormal; Notable for the following components:   RBC 3.85 (*)    Hemoglobin 11.3 (*)    HCT 35.4 (*)    Platelets 403 (*)    All other components within normal limits  I-STAT BETA HCG BLOOD, ED ([facility], [facility], [facility] ONLY)  TROPONIN I (HIGH SENSITIVITY)  TROPONIN I (HIGH SENSITIVITY)    EKG EKG Interpretation  Date/Time:  Friday January 28 2019 15:07:23 EDT Ventricular Rate:  73 PR Interval:  124 QRS Duration: 86 QT Interval:  402 QTC Calculation: 442 R Axis:   90 Text Interpretation:  Sinus rhythm with marked sinus arrhythmia Rightward axis Borderline ECG No significant change since last tracing Confirmed by Wandra Arthurs [phone]) on 01/28/2019 9:12:22 PM   Radiology No results found.  Procedures Procedures (including critical care time)  Medications Ordered in ED Medications  sodium chloride flush (NS) 0.9 % injection 3 mL (has no administration in time range)     Initial Impression / Assessment and Plan / ED Course  I have reviewed the triage vital  signs and the nursing notes.  Pertinent labs & imaging results that were available during my care of the patient were reviewed by me and considered in my medical decision making (see chart for details).        [name] is a 43 year old female with no significant past medical history who presents with a one-week  history of chest pain and shortness of breath.  Chest pain is positional, nonexertional and not typical for cardiac chest pain.  EKG is without evidence for ischemia and troponins were negative x2.  CBC and BMP were unremarkable.  Patient was reassured and instructed to follow-up with PCP for possible cardiac stress test and for management of anxiety.  Final Clinical Impressions(s) / ED Diagnoses   Final diagnoses:  Chest pain, unspecified type    ED Discharge Orders    None       Jeanmarie Hubert, MD 01/28/19 2256    Drenda Freeze, MD 01/29/19 1322

## 2019-01-28 NOTE — ED Notes (Signed)
Patient verbalizes understanding of discharge instructions. Opportunity for questioning and answers were provided. Armband removed by staff, pt discharged from ED ambulatory.   

## 2019-03-02 ENCOUNTER — Ambulatory Visit: Payer: 59 | Admitting: Cardiovascular Disease

## 2019-03-02 NOTE — Progress Notes (Deleted)
Cardiology Office Note:   Date:  03/02/2019  NAME:  [name]    MRN: [id number] DOB:  [birth year]   PCP:  London Pepper, MD  Cardiologist:  No primary care provider on file.  Electrophysiologist:  None   Referring MD: London Pepper, MD   No chief complaint on file. ***  History of Present Illness:   [name] is a 43 y.o. female without significant medical history who is being seen today for the evaluation of chest pain, shortness of breath at the request of London Pepper, MD.  Of note, she was seen in the emergency room on 01/28/2019, for symptoms similar to this.  She had a normal electrocardiogram, and cardiac enzymes are negative x2.  She was discharged with follow-up to [place].  Past Medical History: Past Medical History:  Diagnosis Date  . Dysrhythmia    PVC S  HOLTER MONITOR X ONE MONTH .Marland Kitchen ?? CAFFEINE  . Family history of anesthesia complication    MATERNAL G.MOTHER  SLOW TO WAKE UP DURING OPEN HEART SURGERY .     Past Surgical History: Past Surgical History:  Procedure Laterality Date  . CESAREAN SECTION    . CESAREAN SECTION WITH BILATERAL TUBAL LIGATION Bilateral 08/16/2013   Procedure: CESAREAN SECTION WITH BILATERAL TUBAL LIGATION;  Surgeon: Marylynn Pearson, MD;  Location: [facility];  Service: Obstetrics;  Laterality: Bilateral;  REPEAT   . MASS EXCISION  04/28/2012   Procedure: EXCISION MASS;  Surgeon: Harl Bowie, MD;  Location: [facility];  Service: General;  Laterality: N/A;    Current Medications: No outpatient medications have been marked as taking for the 03/02/19 encounter (Appointment) with O'Neal, Cassie Freer, MD.     Allergies:    Penicillins and Sulfa antibiotics   Social History: Social History   Socioeconomic History  . Marital status: Married    Spouse name: Not on file  . Number of children: Not on file  . Years of education: Not on file  . Highest education level: Not on file  Occupational History  . Not on file  Social Needs  .  Financial resource strain: Not on file  . Food insecurity    Worry: Not on file    Inability: Not on file  . Transportation needs    Medical: Not on file    Non-medical: Not on file  Tobacco Use  . Smoking status: Never Smoker  Substance and Sexual Activity  . Alcohol use: No  . Drug use: No  . Sexual activity: Yes  Lifestyle  . Physical activity    Days per week: Not on file    Minutes per session: Not on file  . Stress: Not on file  Relationships  . Social Herbalist on phone: Not on file    Gets together: Not on file    Attends religious service: Not on file    Active member of club or organization: Not on file    Attends meetings of clubs or organizations: Not on file    Relationship status: Not on file  Other Topics Concern  . Not on file  Social History Narrative  . Not on file     Family History: The patient's ***family history is not on file.  ROS:   All other ROS reviewed and negative. Pertinent positives noted in the HPI.     EKGs/Labs/Other Studies Reviewed:   The following studies were personally reviewed by me today:  EKG:  EKG is ***  ordered today.  The ekg ordered today demonstrates ***, and was personally reviewed by me.   Recent Labs: 01/28/2019: BUN 7; Creatinine, Ser 0.78; Hemoglobin 11.3; Platelets 403; Potassium 3.6; Sodium 137   Recent Lipid Panel No results found for: CHOL, TRIG, HDL, CHOLHDL, VLDL, LDLCALC, LDLDIRECT  Physical Exam:   VS:  There were no vitals taken for this visit.   Wt Readings from Last 3 Encounters:  08/17/13 179 lb (81.2 kg)  08/15/13 179 lb (81.2 kg)  05/17/12 141 lb (64 kg)    General: Well nourished, well developed, in no acute distress Heart: Atraumatic, normal size  Eyes: PEERLA, EOMI  Neck: Supple, no JVD Endocrine: No thryomegaly Cardiac: Normal S1, S2; RRR; no murmurs, rubs, or gallops Lungs: Clear to auscultation bilaterally, no wheezing, rhonchi or rales  Abd: Soft, nontender, no hepatomegaly   Ext: No edema, pulses 2+ Musculoskeletal: No deformities, BUE and BLE strength normal and equal Skin: Warm and dry, no rashes   Neuro: Alert and oriented to person, place, time, and situation, CNII-XII grossly intact, no focal deficits  Psych: Normal mood and affect   ASSESSMENT:   NAME@ is a 43 y.o. female who presents for the following: No diagnosis found.  PLAN:   There are no diagnoses linked to this encounter.  Disposition: No follow-ups on file.  Medication Adjustments/Labs and Tests Ordered: Current medicines are reviewed at length with the patient today.  Concerns regarding medicines are outlined above.  No orders of the defined types were placed in this encounter.  No orders of the defined types were placed in this encounter.   There are no Patient Instructions on file for this visit.   Signed, Addison Naegeli. Audie Box, [place]  [address], [place], [place] [address] [phone]  03/02/2019 8:04 AM

## 2019-03-03 NOTE — Progress Notes (Signed)
Cardiology Office Note:   Date:  03/04/2019  NAME:  [name]    MRN: [id number] DOB:  July 13, 1975   PCP:  London Pepper, MD  Cardiologist:  Evalina Field, MD  Electrophysiologist:  None   Referring MD: London Pepper, MD   Chief Complaint  Patient presents with  . Palpitations   History of Present Illness:   [name] is a 43 y.o. female without significant history who is being seen today for the evaluation of palpitations at the request of London Pepper, MD.  She presents for evaluation of 1 month history of palpitations and chest pain.  She reports around 1 month ago she started noticing every day a fluttering sensation in her chest.  She reports this can happen several times per day and last minutes.  She reports she is reduced her caffeine consumption with some improvement in this.  She reports a past history of having monitors done and been told she had extra heartbeats in the past.  She reports she is able to exercise 3 days/week doing [employer] activities.  She states she can sometimes get the fluttering her chest, but this will subside and she is able to complete her workouts.  Associated symptoms include intermittent chest pain described as sharp lasting 6 hours, that occurs in the center of the chest.  She reports the chest pain occurs with anxiety/stress, and has been also associated with increases in her blood pressure.  She was seen by a family physician, and urgent cares and noted to have high blood pressure with chest pain.  She is ruled out for an acute coronary syndrome on a recent emergency room visit.  Her EKG is normal sinus rhythm with PACs, but no acute changes to suggest ischemia or prior infarction.  There is no recent thyroid studies that I see in the system today.  Overall, she is very healthy and has no real medical problems.  She is not diabetic, does not take any blood pressure medications.  Blood pressure is acceptable today.  Most recent lipid profile showed an  LDL of 126 and 2019.  She takes no real medications.  Her primary care physician urged her to take an acid reflux medication, but she has not tried this yet.  She does report significant stress in her life, with kids at home, work, recent death of her father, and overall concern for her health.  She states she does get anxious and a lot of her symptoms are preceded by anxiety.  Past Medical History: Past Medical History:  Diagnosis Date  . Dysrhythmia    PVC S  HOLTER MONITOR X ONE MONTH .Marland Kitchen ?? CAFFEINE  . Family history of anesthesia complication    MATERNAL G.MOTHER  SLOW TO WAKE UP DURING OPEN HEART SURGERY .     Past Surgical History: Past Surgical History:  Procedure Laterality Date  . CESAREAN SECTION    . CESAREAN SECTION WITH BILATERAL TUBAL LIGATION Bilateral 08/16/2013   Procedure: CESAREAN SECTION WITH BILATERAL TUBAL LIGATION;  Surgeon: Marylynn Pearson, MD;  Location: [facility];  Service: Obstetrics;  Laterality: Bilateral;  REPEAT   . MASS EXCISION  04/28/2012   Procedure: EXCISION MASS;  Surgeon: Harl Bowie, MD;  Location: [facility];  Service: General;  Laterality: N/A;    Current Medications: Current Meds  Medication Sig  . acetaminophen (TYLENOL) 500 MG tablet Take 500 mg by mouth every 6 (six) hours as needed for headache (pain).  Marland Kitchen albuterol (VENTOLIN HFA)  108 (90 Base) MCG/ACT inhaler Inhale 2 puffs into the lungs every 6 (six) hours as needed for wheezing or shortness of breath.     Allergies:    Penicillins and Sulfa antibiotics   Social History: Social History   Socioeconomic History  . Marital status: Married    Spouse name: Not on file  . Number of children: Not on file  . Years of education: Not on file  . Highest education level: Not on file  Occupational History  . Not on file  Social Needs  . Financial resource strain: Not on file  . Food insecurity    Worry: Not on file    Inability: Not on file  . Transportation needs    Medical: Not on  file    Non-medical: Not on file  Tobacco Use  . Smoking status: Never Smoker  . Smokeless tobacco: Never Used  Substance and Sexual Activity  . Alcohol use: Yes  . Drug use: No  . Sexual activity: Yes  Lifestyle  . Physical activity    Days per week: Not on file    Minutes per session: Not on file  . Stress: Not on file  Relationships  . Social Herbalist on phone: Not on file    Gets together: Not on file    Attends religious service: Not on file    Active member of club or organization: Not on file    Attends meetings of clubs or organizations: Not on file    Relationship status: Not on file  Other Topics Concern  . Not on file  Social History Narrative   Accountant      Family History: The patient's family history includes Esophageal cancer in her father; Hyperlipidemia in her father and mother; Hypertension in her father and mother; Myasthenia gravis in her mother.  ROS:   All other ROS reviewed and negative. Pertinent positives noted in the HPI.     EKGs/Labs/Other Studies Reviewed:   The following studies were personally reviewed by me today:  EKG: EKG dated 01/31/2019 reviewed in office.  That EKG was personally reviewed by me in office, and demonstrates normal sinus rhythm, heart rate 73, normal intervals, likely PAC x1, there is an incorrect diagnosis of right axis deviation that is not present this is a normal EKG.   Recent Labs: 01/28/2019: BUN 7; Creatinine, Ser 0.78; Hemoglobin 11.3; Platelets 403; Potassium 3.6; Sodium 137   Recent Lipid Panel No results found for: CHOL, TRIG, HDL, CHOLHDL, VLDL, LDLCALC, LDLDIRECT  Physical Exam:   VS:  BP 134/85   Pulse 73   Ht 5' (1.524 m)   Wt 156 lb (70.8 kg)   SpO2 99%   BMI 30.47 kg/m    Wt Readings from Last 3 Encounters:  03/04/19 156 lb (70.8 kg)  08/17/13 179 lb (81.2 kg)  08/15/13 179 lb (81.2 kg)    General: Well nourished, well developed, in no acute distress Heart: Atraumatic, normal size   Eyes: PEERLA, EOMI  Neck: Supple, no JVD Endocrine: No thryomegaly Cardiac: Normal S1, S2; RRR; no murmurs, rubs, or gallops Lungs: Clear to auscultation bilaterally, no wheezing, rhonchi or rales  Abd: Soft, nontender, no hepatomegaly  Ext: No edema, pulses 2+ Musculoskeletal: No deformities, BUE and BLE strength normal and equal Skin: Warm and dry, no rashes   Neuro: Alert and oriented to person, place, time, and situation, CNII-XII grossly intact, no focal deficits  Psych: Normal mood and affect   ASSESSMENT:  Glenice Bow is a 43 y.o. female who presents for the following: 1. Palpitations   2. Chest pain of uncertain etiology     PLAN:   1. Palpitations 2. Chest pain of uncertain etiology -I highly suspect most of her symptoms are related to anxiety and stress.  She is extremely low risk for any cardiovascular pathology.  Given the palpitations, and her concern, we will proceed with a TSH today in clinic, and obtain a 48-hour ZIO patch.  We also will obtain an echocardiogram given right axis deviation on her EKG, that likely is artifactual.  Given her concern over the chest pain and this causing her anxiety, I think it is reasonable proceed with an exercise treadmill stress test to ensure her that her heart is normal and she is safe to exercise.  I have given her no precautions or limitations with exercise, and she can resume normal level activity as she sees fit.  Disposition: Return in about 3 months (around 06/03/2019).  Medication Adjustments/Labs and Tests Ordered: Current medicines are reviewed at length with the patient today.  Concerns regarding medicines are outlined above.  Orders Placed This Encounter  Procedures  . TSH  . HOLTER MONITOR - 48 HOUR  . EXERCISE TOLERANCE TEST (ETT)  . ECHOCARDIOGRAM COMPLETE   No orders of the defined types were placed in this encounter.   There are no Patient Instructions on file for this visit.   Signed, Addison Naegeli. Audie Box, [facility]  [address] [place], [place] [address] [phone]  03/04/2019 10:32 AM

## 2019-03-04 ENCOUNTER — Other Ambulatory Visit: Payer: Self-pay

## 2019-03-04 ENCOUNTER — Encounter: Payer: Self-pay | Admitting: Cardiovascular Disease

## 2019-03-04 ENCOUNTER — Ambulatory Visit: Payer: 59 | Admitting: Cardiovascular Disease

## 2019-03-04 VITALS — BP 134/85 | HR 73 | Ht 60.0 in | Wt 156.0 lb

## 2019-03-04 DIAGNOSIS — R002 Palpitations: Secondary | ICD-10-CM | POA: Diagnosis not present

## 2019-03-04 DIAGNOSIS — R0789 Other chest pain: Secondary | ICD-10-CM

## 2019-03-04 DIAGNOSIS — R079 Chest pain, unspecified: Secondary | ICD-10-CM

## 2019-03-04 LAB — TSH: TSH: 1.07 u[IU]/mL (ref 0.450–4.500)

## 2019-03-04 NOTE — Patient Instructions (Signed)
Medication Instructions:  Continue current medications  If you need a refill on your cardiac medications before your next appointment, please call your pharmacy.  Labwork: TSH Today HERE IN OUR OFFICE AT [facility]  You will NOT need to fast   Take the provided lab slips with you to the lab for your blood draw.   When you have your labs (blood work) drawn today and your tests are completely normal, you will receive your results only by MyChart Message (if you have MyChart) -OR-  A paper copy in the mail.  If you have any lab test that is abnormal or we need to change your treatment, we will call you to review these results.  Testing/Procedures: Your physician has requested that you have an echocardiogram. Echocardiography is a painless test that uses sound waves to create images of your heart. It provides your doctor with information about the size and shape of your heart and how well your heart's chambers and valves are working. This procedure takes approximately one hour. There are no restrictions for this procedure.  Your physician has requested that you have an exercise tolerance test. For further information please visit [email or web address]. Please also follow instruction sheet, as given.  Your physician has recommended that you wear a 3 days Zio Patch monitor. Holter monitors are medical devices that record the heart's electrical activity. Doctors most often use these monitors to diagnose arrhythmias. Arrhythmias are problems with the speed or rhythm of the heartbeat. The monitor is a small, portable device. You can wear one while you do your normal daily activities. This is usually used to diagnose what is causing palpitations/syncope (passing out).   Follow-Up: You will need a follow up appointment in 3 months.  Please call our office 2 months in advance to schedule this appointment.  You may see Evalina Field, MD or one of the following Advanced Practice Providers on your designated Care  Team:   Rosaria Ferries, PA-C . Jory Sims, DNP, ANP     At [facility], you and your health needs are our priority.  As part of our continuing mission to provide you with exceptional heart care, we have created designated Provider Care Teams.  These Care Teams include your primary Cardiologist (physician) and Advanced Practice Providers (APPs -  Physician Assistants and Nurse Practitioners) who all work together to provide you with the care you need, when you need it.  Thank you for choosing [facility] at [facility]!!

## 2019-03-07 ENCOUNTER — Telehealth: Payer: Self-pay | Admitting: *Deleted

## 2019-03-10 ENCOUNTER — Telehealth: Payer: Self-pay | Admitting: *Deleted

## 2019-03-10 NOTE — Telephone Encounter (Signed)
3 day ZIO XT long term holter monitor to be mailed to patients home.  Patient will be out of town until Saturday,  03/19/2019.  We will request monitor start date of 03/18/19 so monitor will not be sitting in her mail box.  Instructions reviewed briefly as they are included in the monitor kit.  Patient asked to complete monitor before Echocardiogram and Stress Test on 03/25/2019 as the monitor cannot be removed and reapplied.

## 2019-03-19 ENCOUNTER — Ambulatory Visit (INDEPENDENT_AMBULATORY_CARE_PROVIDER_SITE_OTHER): Payer: 59

## 2019-03-19 DIAGNOSIS — R002 Palpitations: Secondary | ICD-10-CM | POA: Diagnosis not present

## 2019-03-21 ENCOUNTER — Inpatient Hospital Stay (HOSPITAL_COMMUNITY): Admission: RE | Admit: 2019-03-21 | Payer: 59 | Source: Ambulatory Visit

## 2019-03-22 ENCOUNTER — Other Ambulatory Visit: Payer: Self-pay

## 2019-03-22 ENCOUNTER — Other Ambulatory Visit (HOSPITAL_COMMUNITY)
Admission: RE | Admit: 2019-03-22 | Discharge: 2019-03-22 | Disposition: A | Discharge status: Home / Self Care | Payer: 59 | Source: Ambulatory Visit | Attending: Cardiovascular Disease | Admitting: Cardiovascular Disease

## 2019-03-22 DIAGNOSIS — Z20828 Contact with and (suspected) exposure to other viral communicable diseases: Secondary | ICD-10-CM | POA: Diagnosis not present

## 2019-03-22 DIAGNOSIS — Z01812 Encounter for preprocedural laboratory examination: Secondary | ICD-10-CM | POA: Diagnosis present

## 2019-03-23 LAB — NOVEL CORONAVIRUS, NAA (HOSP ORDER, SEND-OUT TO REF LAB; TAT 18-24 HRS): SARS-CoV-2, NAA: NOT DETECTED

## 2019-03-25 ENCOUNTER — Ambulatory Visit (HOSPITAL_COMMUNITY): Payer: 59 | Attending: Internal Medicine

## 2019-03-25 ENCOUNTER — Other Ambulatory Visit: Payer: Self-pay

## 2019-03-25 ENCOUNTER — Ambulatory Visit (HOSPITAL_BASED_OUTPATIENT_CLINIC_OR_DEPARTMENT_OTHER): Payer: 59

## 2019-03-25 DIAGNOSIS — R079 Chest pain, unspecified: Secondary | ICD-10-CM | POA: Diagnosis present

## 2019-03-25 LAB — EXERCISE TOLERANCE TEST
Estimated workload: 14.3 METS
Exercise duration (min): 12 min
Exercise duration (sec): 30 s
MPHR: 176 {beats}/min
Peak HR: 176 {beats}/min
Percent HR: 100 %
RPE: 19
Rest HR: 65 {beats}/min

## 2019-05-30 NOTE — Progress Notes (Deleted)
Cardiology Office Note:   Date:  05/30/2019  NAME:  [name]    MRN: [id number] DOB:  1975-07-27   PCP:  London Pepper, MD  Cardiologist:  Evalina Field, MD  Electrophysiologist:  None   Referring MD: London Pepper, MD   No chief complaint on file. ***  History of Present Illness:   [name] is a 43 y.o. female with a hx of palpitations who presents for follow-up. Recent ETT normal with 12:30 min exercise on Bruce protocol. Echo normal. Rare ectopy on monitor and no arrhythmias.        Past Medical History: Past Medical History:  Diagnosis Date  . Dysrhythmia    PVC S  HOLTER MONITOR X ONE MONTH .Marland Kitchen ?? CAFFEINE  . Family history of anesthesia complication    MATERNAL G.MOTHER  SLOW TO WAKE UP DURING OPEN HEART SURGERY .     Past Surgical History: Past Surgical History:  Procedure Laterality Date  . CESAREAN SECTION    . CESAREAN SECTION WITH BILATERAL TUBAL LIGATION Bilateral 08/16/2013   Procedure: CESAREAN SECTION WITH BILATERAL TUBAL LIGATION;  Surgeon: Marylynn Pearson, MD;  Location: [facility];  Service: Obstetrics;  Laterality: Bilateral;  REPEAT   . MASS EXCISION  04/28/2012   Procedure: EXCISION MASS;  Surgeon: Harl Bowie, MD;  Location: [facility];  Service: General;  Laterality: N/A;    Current Medications: No outpatient medications have been marked as taking for the 06/03/19 encounter (Appointment) with O'Neal, Cassie Freer, MD.     Allergies:    Penicillins and Sulfa antibiotics   Social History: Social History   Socioeconomic History  . Marital status: Married    Spouse name: Not on file  . Number of children: Not on file  . Years of education: Not on file  . Highest education level: Not on file  Occupational History  . Not on file  Social Needs  . Financial resource strain: Not on file  . Food insecurity    Worry: Not on file    Inability: Not on file  . Transportation needs    Medical: Not on file    Non-medical: Not on file   Tobacco Use  . Smoking status: Never Smoker  . Smokeless tobacco: Never Used  Substance and Sexual Activity  . Alcohol use: Yes  . Drug use: No  . Sexual activity: Yes  Lifestyle  . Physical activity    Days per week: Not on file    Minutes per session: Not on file  . Stress: Not on file  Relationships  . Social Herbalist on phone: Not on file    Gets together: Not on file    Attends religious service: Not on file    Active member of club or organization: Not on file    Attends meetings of clubs or organizations: Not on file    Relationship status: Not on file  Other Topics Concern  . Not on file  Social History Narrative   Accountant      Family History: The patient's ***family history includes Esophageal cancer in her father; Hyperlipidemia in her father and mother; Hypertension in her father and mother; Myasthenia gravis in her mother.  ROS:   All other ROS reviewed and negative. Pertinent positives noted in the HPI.     EKGs/Labs/Other Studies Reviewed:   The following studies were personally reviewed by me today:  EKG:  EKG is *** ordered today.  The ekg ordered  today demonstrates ***, and was personally reviewed by me.  TTE 03/25/2019  1. Left ventricular ejection fraction, by visual estimation, is 60 to 65%. The left ventricle has normal function. Normal left ventricular size. There is no left ventricular hypertrophy.  2. Global right ventricle has normal systolic function.The right ventricular size is normal. No increase in right ventricular wall thickness.  3. Left atrial size was normal.  4. Right atrial size was normal.  5. The mitral valve is normal in structure. Trace mitral valve regurgitation.  6. The tricuspid valve is normal in structure. Tricuspid valve regurgitation is trivial.  7. .  Holter 04/05/2019 Patient had a min HR of 40 bpm (sinus bradycardia during sleep), max HR of 167 bpm (sinus tachycardia vs artifact), and avg HR of 67 bpm  (normal sinus rhythm). Predominant underlying rhythm was Sinus Rhythm. Isolated SVEs were rare (<1.0%), and no SVE Couplets or SVE Triplets were present. Isolated VEs were rare (<1.0%), and no VE Couplets or VE Triplets were present. There were 11 patient triggered events that coincided with normal sinus rhythm (51-157 bpm). There were no diary recordings submitted. No atrial fibrillation/flutter detected.  Impression: 1. Rare supraventricular/ventricular ectopy.  2. No arrhythmias detected.   ETT 03/25/2019  Blood pressure demonstrated a normal response to exercise.  There was no ST segment deviation noted during stress.   ETT with good exercise tolerance (12:30); no chest pain; normal BP response (SBP 117 in stage 4 felt to be inaccurate; no ST changes; negative adequate ETT; Duke treadmill score 12.   Recent Labs: 01/28/2019: BUN 7; Creatinine, Ser 0.78; Hemoglobin 11.3; Platelets 403; Potassium 3.6; Sodium 137 03/04/2019: TSH 1.070   Recent Lipid Panel No results found for: CHOL, TRIG, HDL, CHOLHDL, VLDL, LDLCALC, LDLDIRECT  Physical Exam:   VS:  There were no vitals taken for this visit.   Wt Readings from Last 3 Encounters:  03/25/19 152 lb (68.9 kg)  03/04/19 156 lb (70.8 kg)  08/17/13 179 lb (81.2 kg)    General: Well nourished, well developed, in no acute distress Heart: Atraumatic, normal size  Eyes: PEERLA, EOMI  Neck: Supple, no JVD Endocrine: No thryomegaly Cardiac: Normal S1, S2; RRR; no murmurs, rubs, or gallops Lungs: Clear to auscultation bilaterally, no wheezing, rhonchi or rales  Abd: Soft, nontender, no hepatomegaly  Ext: No edema, pulses 2+ Musculoskeletal: No deformities, BUE and BLE strength normal and equal Skin: Warm and dry, no rashes   Neuro: Alert and oriented to person, place, time, and situation, CNII-XII grossly intact, no focal deficits  Psych: Normal mood and affect   ASSESSMENT:   [name] is a 43 y.o. female who presents for the following:  No diagnosis found.  PLAN:   There are no diagnoses linked to this encounter.  Disposition: No follow-ups on file.  Medication Adjustments/Labs and Tests Ordered: Current medicines are reviewed at length with the patient today.  Concerns regarding medicines are outlined above.  No orders of the defined types were placed in this encounter.  No orders of the defined types were placed in this encounter.   There are no Patient Instructions on file for this visit.   Signed, Addison Naegeli. Audie Box, [facility]  [address], [place], [place] [address] [phone]  05/30/2019 12:38 PM

## 2019-06-03 ENCOUNTER — Ambulatory Visit: Payer: 59 | Admitting: Cardiovascular Disease

## 2019-11-15 ENCOUNTER — Other Ambulatory Visit: Payer: Self-pay | Admitting: Obstetrics and Gynecology

## 2019-11-15 DIAGNOSIS — N6320 Unspecified lump in the left breast, unspecified quadrant: Secondary | ICD-10-CM

## 2019-11-23 ENCOUNTER — Ambulatory Visit
Admission: RE | Admit: 2019-11-23 | Discharge: 2019-11-23 | Disposition: A | Discharge status: Home / Self Care | Payer: 59 | Source: Ambulatory Visit | Attending: Obstetrics and Gynecology | Admitting: Obstetrics and Gynecology

## 2019-11-23 ENCOUNTER — Other Ambulatory Visit: Payer: Self-pay

## 2019-11-23 DIAGNOSIS — N6320 Unspecified lump in the left breast, unspecified quadrant: Secondary | ICD-10-CM

## 2021-10-05 IMAGING — MG DIGITAL DIAGNOSTIC BILAT W/ TOMO W/ CAD
6 of 10 series · 6 of 30 positions shown · non-contrast
Comparison: None.

CLINICAL DATA: Patient reports a palpable lump in the upper outer
left breast.

EXAM:
DIGITAL DIAGNOSTIC BILATERAL MAMMOGRAM WITH CAD AND TOMO
ULTRASOUND LEFT BREAST

[L MLO synth-2D]
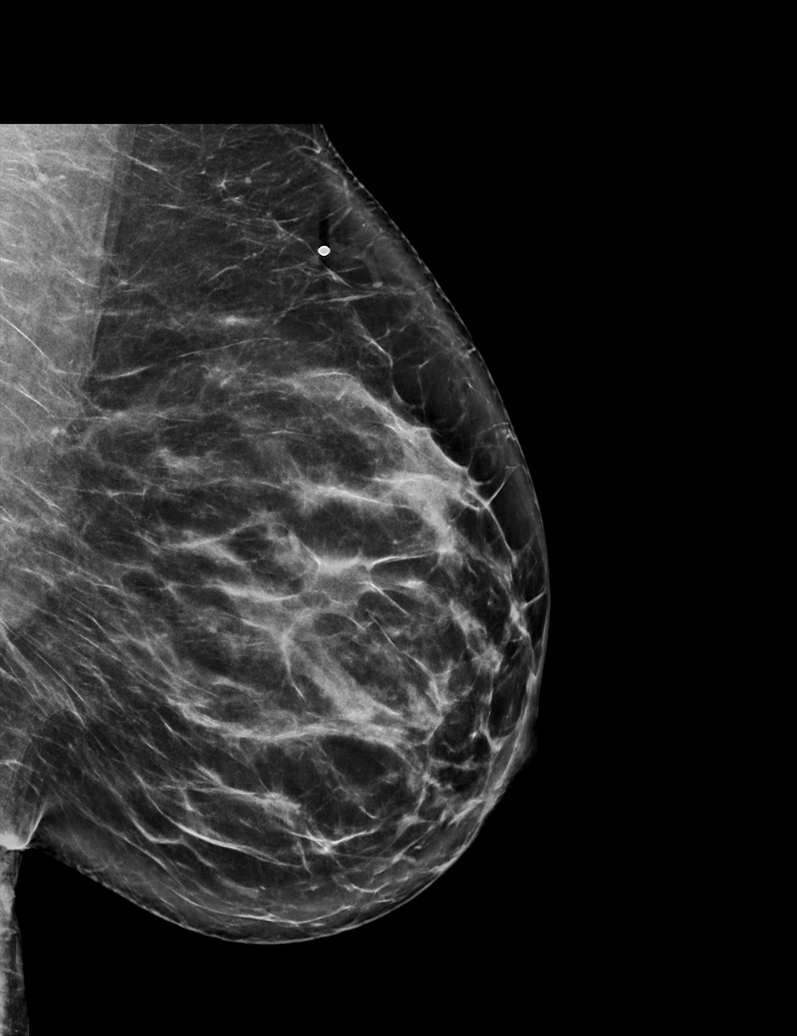

[L CC synth-2D (1 of 2)]
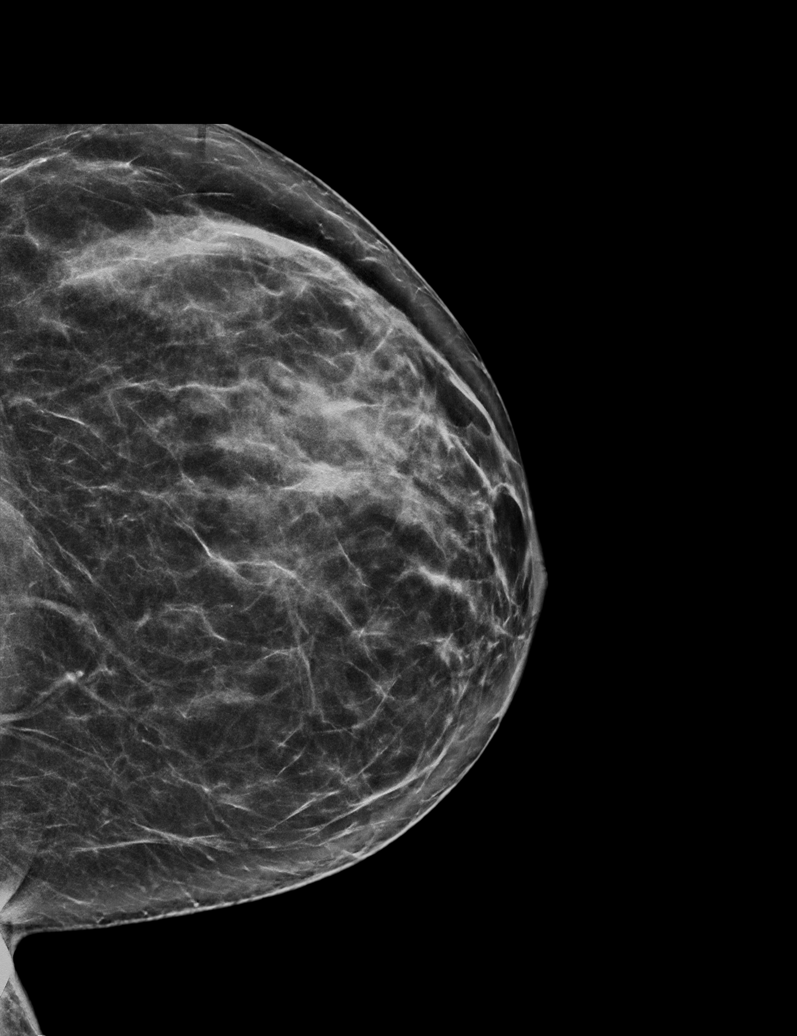

[L CC synth-2D (2 of 2)]
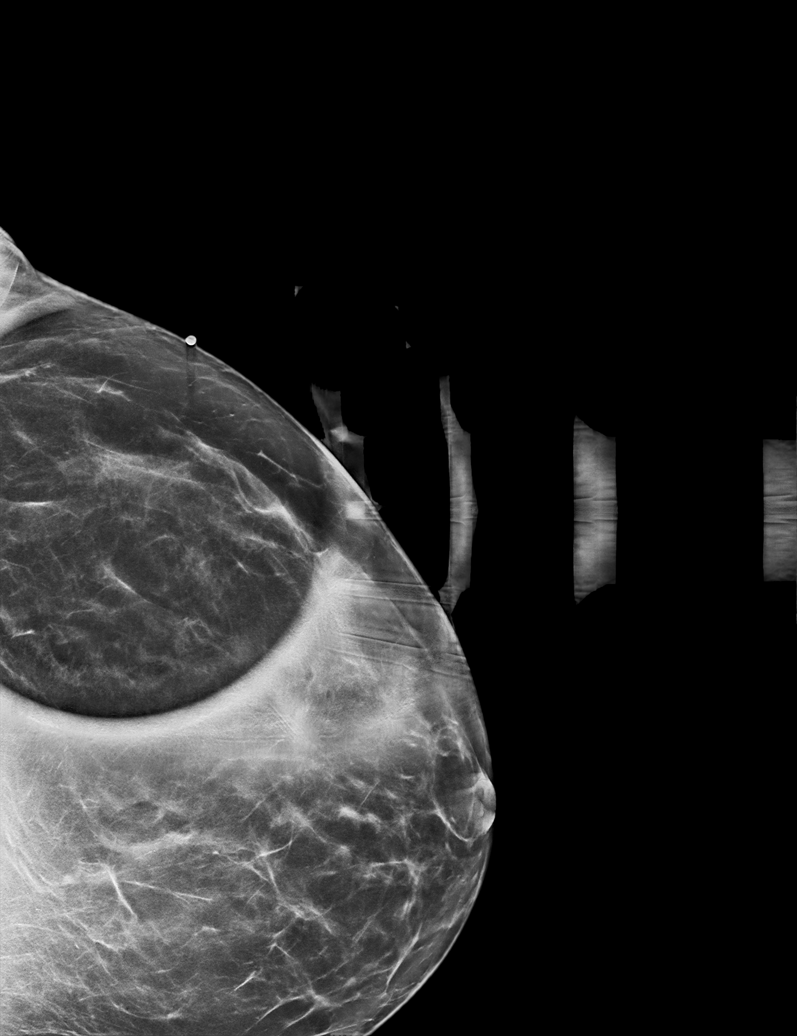

[R MLO synth-2D]
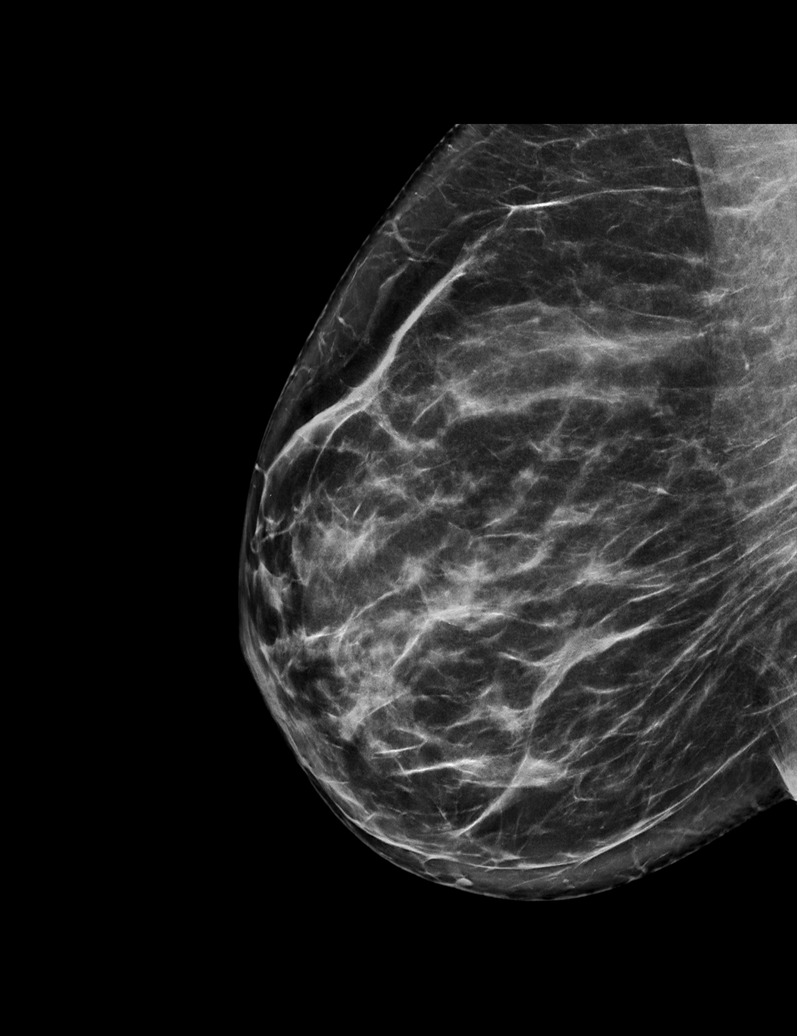

[R CC synth-2D]
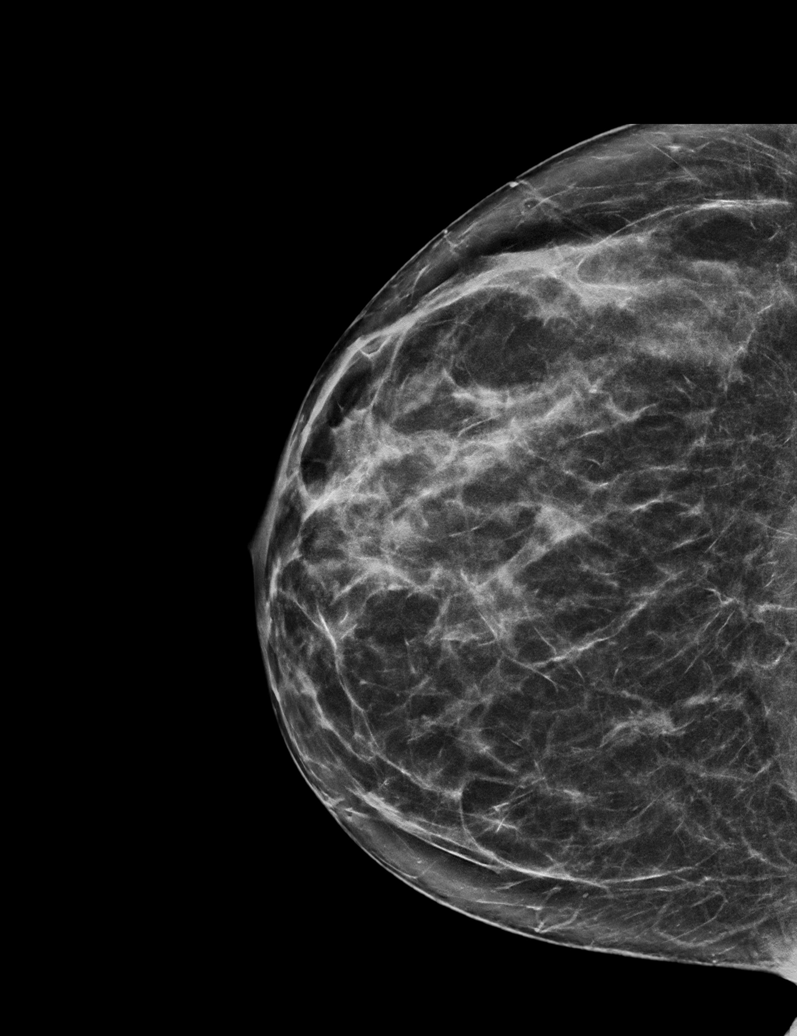

[R MLO tomo · tomo slice 37/74.0]
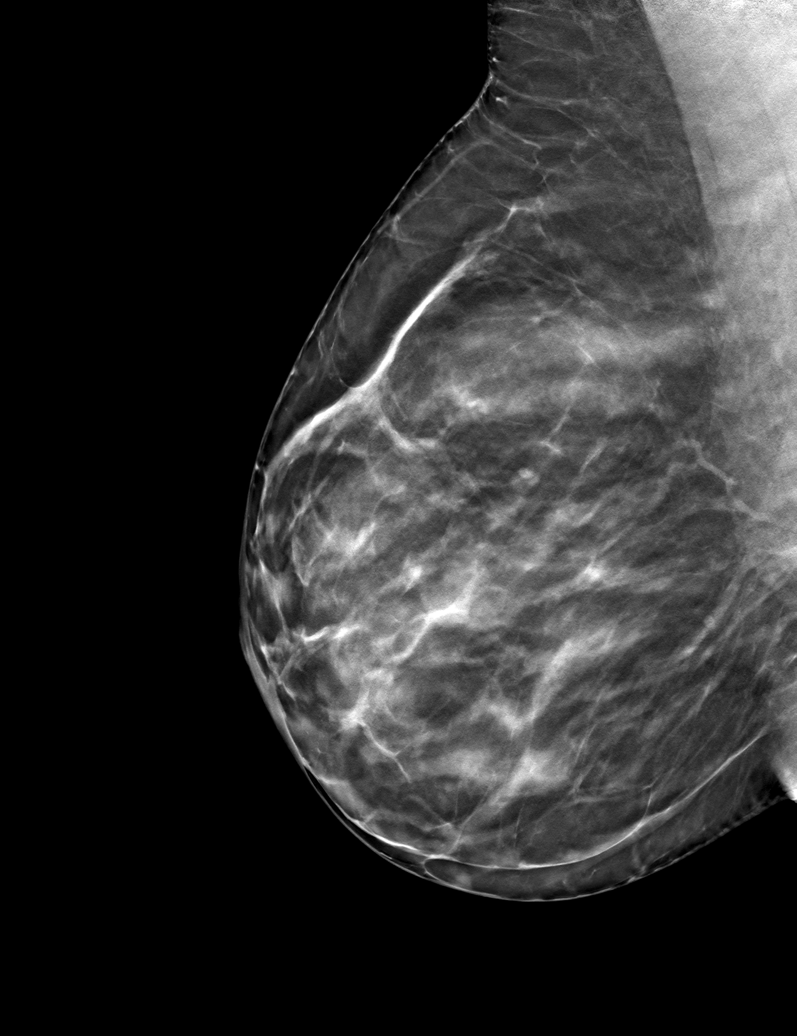

[6 of 30 positions shown; findings below may reference images not displayed]

ACR Breast Density Category c: The breast tissue is heterogeneously
dense, which may obscure small masses.
FINDINGS: There are no masses, areas of architectural distortion, areas of
significant asymmetry or suspicious calcifications.

Mammographic images were processed with CAD.

On physical exam, there is a small, smooth, palpable nodule in the
superficial soft tissues of the upper outer left breast.

Targeted ultrasound is performed, showing normal fibroglandular
tissue in the upper outer left breast corresponding to the palpable
abnormality. The small palpable abnormality appears due to a lobule
of normal breast fat. There are no masses or areas of abnormal
shadowing.
IMPRESSION: 1. Negative exam.  No evidence of breast malignancy.

RECOMMENDATION:
Screening mammogram in one year.(Code:[id number])

I have discussed the findings and recommendations with the patient.
If applicable, a reminder letter will be sent to the patient
regarding the next appointment.

BI-RADS CATEGORY  1: Negative.

## 2022-04-10 ENCOUNTER — Ambulatory Visit: Payer: 59 | Admitting: Podiatry

## 2022-04-10 ENCOUNTER — Ambulatory Visit (INDEPENDENT_AMBULATORY_CARE_PROVIDER_SITE_OTHER): Payer: 59

## 2022-04-10 DIAGNOSIS — M7731 Calcaneal spur, right foot: Secondary | ICD-10-CM | POA: Diagnosis not present

## 2022-04-10 DIAGNOSIS — M722 Plantar fascial fibromatosis: Secondary | ICD-10-CM

## 2022-04-10 DIAGNOSIS — M79671 Pain in right foot: Secondary | ICD-10-CM

## 2022-04-10 NOTE — Progress Notes (Signed)
Subjective:   Patient ID: [name], female   DOB: 46 y.o.   MRN: [id number]   HPI Chief Complaint  Patient presents with   Foot Pain    Patient came in today for right foot heel arch pain started month ago, rate of pain 7 out of 10, Sharp burning pain, patient walks on the side of the foot to relieve the pain, TX: icing, heat, taping, ace bandage, pain meds, heel pads, X-Rays taking today    46 female presents with above concerns.  She has had 3 bosy and each time the foot got more flat. She likes to run and and work out. She has had a history of planar fasciitis She has tried changing shoes and inserts and typically that helps. This started after doing boxy jumps and she felt a pulling sensation, no popping . Afer it srted to burn and hurt. She tried ice/heat, KT tape. She started to get paion on the right calf along the side. She thinks it is because the heel is hurting and she is walking on the side of her foot. The heel pain started on the medial aspect but now moving to the lateral. It is not trobbing. She wears shoes all the time because it feels sharp on the bottom of th eheel if she goes barefot. She has not been running, jumbing and sh ehas been using the bike. As long as she keeps it wrapped it helps. Stretching has been helping. It is strating to feel better but nervous to start running.    Review of Systems  All other systems reviewed and are negative.  Past Medical History:  Diagnosis Date   Dysrhythmia    PVC S  HOLTER MONITOR X ONE MONTH .Marland Kitchen ?? CAFFEINE   Family history of anesthesia complication    MATERNAL G.MOTHER  SLOW TO WAKE UP DURING OPEN HEART SURGERY .     Past Surgical History:  Procedure Laterality Date   CESAREAN SECTION     CESAREAN SECTION WITH BILATERAL TUBAL LIGATION Bilateral 08/16/2013   Procedure: CESAREAN SECTION WITH BILATERAL TUBAL LIGATION;  Surgeon: Marylynn Pearson, MD;  Location: [facility];  Service: Obstetrics;  Laterality: Bilateral;  REPEAT     MASS EXCISION  04/28/2012   Procedure: EXCISION MASS;  Surgeon: Harl Bowie, MD;  Location: [facility];  Service: General;  Laterality: N/A;     Current Outpatient Medications:    acetaminophen (TYLENOL) 500 MG tablet, Take 500 mg by mouth every 6 (six) hours as needed for headache (pain)., Disp: , Rfl:    albuterol (VENTOLIN HFA) 108 (90 Base) MCG/ACT inhaler, Inhale 2 puffs into the lungs every 6 (six) hours as needed for wheezing or shortness of breath., Disp: , Rfl:   Allergies  Allergen Reactions   Penicillins Hives and Shortness Of Breath    Did it involve swelling of the face/tongue/throat, SOB, or low BP? Yes Did it involve sudden or severe rash/hives, skin peeling, or any reaction on the inside of your mouth or nose? Yes Did you need to seek medical attention at a hospital or doctor's office? No When did it last happen?      teenager If all above answers are "NO", may proceed with cephalosporin use.   Sulfa Antibiotics Hives and Itching           Objective:  Physical Exam  General: AAO x3, NAD  Dermatological: Skin is warm, dry and supple bilateral. There are no open sores, no preulcerative lesions,  no rash or signs of infection present.  Vascular: Dorsalis Pedis artery and Posterior Tibial artery pedal pulses are 2/4 bilateral with immedate capillary fill time.  There is no pain with calf compression, swelling, warmth, erythema.   Neruologic: Grossly intact via light touch bilateral.  Negative Tinel sign.  Musculoskeletal: Tenderness to palpation along the plantar medial tubercle of the calcaneus at the insertion of plantar fascia on the right foot. There is no pain along the course of the plantar fascia within the arch of the foot. Plantar fascia appears to be intact. There is no pain with lateral compression of the calcaneus or pain with vibratory sensation. There is no pain along the course or insertion of the achilles tendon. No other areas of tenderness to bilateral  lower extremities. Muscular strength 5/5 in all groups tested bilateral.  Gait: Unassisted, Nonantalgic.       Assessment:   Right heel pain, Plantar fasciitis     Plan:  -Treatment options discussed including all alternatives, risks, and complications -Etiology of symptoms were discussed -X-rays were obtained and reviewed with the patient.  3 views right foot were obtained.  No evidence of acute fracture.  Inferior calcaneal spurs present. -We discussed anti-inflammatories as needed.  Icing daily.  We discussed stretching, rehab exercises.  Dispensed night splint to help stretch the Achilles tendon, plantar fascia.  Discussed changing shoes and arch supports as well.  We discussed activity to help with this as well.  Ultimately held on steroid injection today.  Trula Slade DPM

## 2022-04-10 NOTE — Patient Instructions (Signed)
For instructions on how to put on your Night Splint, please visit [email or web address]  If was nice to meet you today. If you have any questions or any further concerns, please feel fee to give me a call. You can call our office at [phone] or please feel fee to send me a message through [place].   ---  Plantar Fasciitis (Heel Spur Syndrome) with Rehab The plantar fascia is a fibrous, ligament-like, soft-tissue structure that spans the bottom of the foot. Plantar fasciitis is a condition that causes pain in the foot due to inflammation of the tissue. SYMPTOMS  Pain and tenderness on the underneath side of the foot. Pain that worsens with standing or walking. CAUSES  Plantar fasciitis is caused by irritation and injury to the plantar fascia on the underneath side of the foot. Common mechanisms of injury include: Direct trauma to bottom of the foot. Damage to a small nerve that runs under the foot where the main fascia attaches to the heel bone. Stress placed on the plantar fascia due to bone spurs. RISK INCREASES WITH:  Activities that place stress on the plantar fascia (running, jumping, pivoting, or cutting). Poor strength and flexibility. Improperly fitted shoes. Tight calf muscles. Flat feet. Failure to warm-up properly before activity. Obesity. PREVENTION Warm up and stretch properly before activity. Allow for adequate recovery between workouts. Maintain physical fitness: Strength, flexibility, and endurance. Cardiovascular fitness. Maintain a health body weight. Avoid stress on the plantar fascia. Wear properly fitted shoes, including arch supports for individuals who have flat feet.  PROGNOSIS  If treated properly, then the symptoms of plantar fasciitis usually resolve without surgery. However, occasionally surgery is necessary.  RELATED COMPLICATIONS  Recurrent symptoms that may result in a chronic condition. Problems of the lower back that are caused by  compensating for the injury, such as limping. Pain or weakness of the foot during push-off following surgery. Chronic inflammation, scarring, and partial or complete fascia tear, occurring more often from repeated injections.  TREATMENT  Treatment initially involves the use of ice and medication to help reduce pain and inflammation. The use of strengthening and stretching exercises may help reduce pain with activity, especially stretches of the Achilles tendon. These exercises may be performed at home or with a therapist. Your caregiver may recommend that you use heel cups of arch supports to help reduce stress on the plantar fascia. Occasionally, corticosteroid injections are given to reduce inflammation. If symptoms persist for greater than 6 months despite non-surgical (conservative), then surgery may be recommended.   MEDICATION  If pain medication is necessary, then nonsteroidal anti-inflammatory medications, such as aspirin and ibuprofen, or other minor pain relievers, such as acetaminophen, are often recommended. Do not take pain medication within 7 days before surgery. Prescription pain relievers may be given if deemed necessary by your caregiver. Use only as directed and only as much as you need. Corticosteroid injections may be given by your caregiver. These injections should be reserved for the most serious cases, because they may only be given a certain number of times.  HEAT AND COLD Cold treatment (icing) relieves pain and reduces inflammation. Cold treatment should be applied for 10 to 15 minutes every 2 to 3 hours for inflammation and pain and immediately after any activity that aggravates your symptoms. Use ice packs or massage the area with a piece of ice (ice massage). Heat treatment may be used prior to performing the stretching and strengthening activities prescribed by your caregiver, physical therapist, or athletic trainer. Use  physical therapist, or athletic trainer. Use a heat pack or soak the injury  in warm water.  SEEK IMMEDIATE MEDICAL CARE IF: Treatment seems to offer no benefit, or the condition worsens. Any medications produce adverse side effects.  EXERCISES- RANGE OF MOTION (ROM) AND STRETCHING EXERCISES - Plantar Fasciitis (Heel Spur Syndrome) These exercises may help you when beginning to rehabilitate your injury. Your symptoms may resolve with or without further involvement from your physician, physical therapist or athletic trainer. While completing these exercises, remember:  Restoring tissue flexibility helps normal motion to return to the joints. This allows healthier, less painful movement and activity. An effective stretch should be held for at least 30 seconds. A stretch should never be painful. You should only feel a gentle lengthening or release in the stretched tissue.  RANGE OF MOTION - Toe Extension, Flexion Sit with your right / left leg crossed over your opposite knee. Grasp your toes and gently pull them back toward the top of your foot. You should feel a stretch on the bottom of your toes and/or foot. Hold this stretch for 10 seconds. Now, gently pull your toes toward the bottom of your foot. You should feel a stretch on the top of your toes and or foot. Hold this stretch for 10 seconds. Repeat  times. Complete this stretch 3 times per day.   RANGE OF MOTION - Ankle Dorsiflexion, Active Assisted Remove shoes and sit on a chair that is preferably not on a carpeted surface. Place right / left foot under knee. Extend your opposite leg for support. Keeping your heel down, slide your right / left foot back toward the chair until you feel a stretch at your ankle or calf. If you do not feel a stretch, slide your bottom forward to the edge of the chair, while still keeping your heel down. Hold this stretch for 10 seconds. Repeat 3 times. Complete this stretch 2 times per day.   STRETCH  Gastroc, Standing Place hands on wall. Extend right / left leg, keeping the  front knee somewhat bent. Slightly point your toes inward on your back foot. Keeping your right / left heel on the floor and your knee straight, shift your weight toward the wall, not allowing your back to arch. You should feel a gentle stretch in the right / left calf. Hold this position for 10 seconds. Repeat 3 times. Complete this stretch 2 times per day.  STRETCH  Soleus, Standing Place hands on wall. Extend right / left leg, keeping the other knee somewhat bent. Slightly point your toes inward on your back foot. Keep your right / left heel on the floor, bend your back knee, and slightly shift your weight over the back leg so that you feel a gentle stretch deep in your back calf. Hold this position for 10 seconds. Repeat 3 times. Complete this stretch 2 times per day.  STRETCH  Gastrocsoleus, Standing  Note: This exercise can place a lot of stress on your foot and ankle. Please complete this exercise only if specifically instructed by your caregiver.  Place the ball of your right / left foot on a step, keeping your other foot firmly on the same step. Hold on to the wall or a rail for balance. Slowly lift your other foot, allowing your body weight to press your heel down over the edge of the step. You should feel a stretch in your right / left calf. Hold this position for 10 seconds. Repeat this exercise with a   slight bend in your right / left knee. Repeat 3 times. Complete this stretch 2 times per day.   STRENGTHENING EXERCISES - Plantar Fasciitis (Heel Spur Syndrome)  These exercises may help you when beginning to rehabilitate your injury. They may resolve your symptoms with or without further involvement from your physician, physical therapist or athletic trainer. While completing these exercises, remember:  Muscles can gain both the endurance and the strength needed for everyday activities through controlled exercises. Complete these exercises as instructed by your physician,  physical therapist or athletic trainer. Progress the resistance and repetitions only as guided.  STRENGTH - Towel Curls Sit in a chair positioned on a non-carpeted surface. Place your foot on a towel, keeping your heel on the floor. Pull the towel toward your heel by only curling your toes. Keep your heel on the floor. Repeat 3 times. Complete this exercise 2 times per day.  STRENGTH - Ankle Inversion Secure one end of a rubber exercise band/tubing to a fixed object (table, pole). Loop the other end around your foot just before your toes. Place your fists between your knees. This will focus your strengthening at your ankle. Slowly, pull your big toe up and in, making sure the band/tubing is positioned to resist the entire motion. Hold this position for 10 seconds. Have your muscles resist the band/tubing as it slowly pulls your foot back to the starting position. Repeat 3 times. Complete this exercises 2 times per day.  Document Released: 06/09/2005 Document Revised: 09/01/2011 Document Reviewed: 09/21/2008 [employer] Patient Information 2014 [employer]  

## 2022-04-22 ENCOUNTER — Other Ambulatory Visit: Payer: Self-pay | Admitting: Podiatry

## 2022-04-22 DIAGNOSIS — M722 Plantar fascial fibromatosis: Secondary | ICD-10-CM
